# Patient Record
Sex: Male | Born: 1960 | Race: White | Hispanic: No | Marital: Married | Smoking: Never smoker
Health system: Southern US, Community
[De-identification: ages and names within clinical notes are randomized; demographics above are authoritative.]

## PROBLEM LIST (undated history)

## (undated) DIAGNOSIS — F419 Anxiety disorder, unspecified: Secondary | ICD-10-CM

## (undated) DIAGNOSIS — M1990 Unspecified osteoarthritis, unspecified site: Secondary | ICD-10-CM

## (undated) DIAGNOSIS — M47817 Spondylosis without myelopathy or radiculopathy, lumbosacral region: Secondary | ICD-10-CM

## (undated) DIAGNOSIS — J320 Chronic maxillary sinusitis: Secondary | ICD-10-CM

## (undated) DIAGNOSIS — M4800 Spinal stenosis, site unspecified: Secondary | ICD-10-CM

## (undated) DIAGNOSIS — K1370 Unspecified lesions of oral mucosa: Secondary | ICD-10-CM

## (undated) DIAGNOSIS — M51379 Other intervertebral disc degeneration, lumbosacral region without mention of lumbar back pain or lower extremity pain: Secondary | ICD-10-CM

## (undated) DIAGNOSIS — N529 Male erectile dysfunction, unspecified: Secondary | ICD-10-CM

## (undated) DIAGNOSIS — M5137 Other intervertebral disc degeneration, lumbosacral region: Secondary | ICD-10-CM

## (undated) DIAGNOSIS — M549 Dorsalgia, unspecified: Secondary | ICD-10-CM

## (undated) HISTORY — DX: Anxiety disorder, unspecified: F41.9

## (undated) HISTORY — DX: Chronic maxillary sinusitis: J32.0

## (undated) HISTORY — PX: BACK SURGERY: SHX140

## (undated) HISTORY — DX: Other intervertebral disc degeneration, lumbosacral region: M51.37

## (undated) HISTORY — DX: Spondylosis without myelopathy or radiculopathy, lumbosacral region: M47.817

## (undated) HISTORY — DX: Unspecified osteoarthritis, unspecified site: M19.90

## (undated) HISTORY — DX: Unspecified lesions of oral mucosa: K13.70

## (undated) HISTORY — DX: Dorsalgia, unspecified: M54.9

## (undated) HISTORY — DX: Spinal stenosis, site unspecified: M48.00

## (undated) HISTORY — DX: Other intervertebral disc degeneration, lumbosacral region without mention of lumbar back pain or lower extremity pain: M51.379

## (undated) HISTORY — DX: Male erectile dysfunction, unspecified: N52.9

## (undated) HISTORY — PX: COLONOSCOPY: SHX174

---

## 2011-03-18 ENCOUNTER — Institutional Professional Consult (permissible substitution): Payer: Self-pay | Admitting: Internal Medicine

## 2012-09-21 ENCOUNTER — Encounter: Payer: Self-pay | Admitting: Internal Medicine

## 2012-12-20 ENCOUNTER — Ambulatory Visit: Payer: Self-pay | Admitting: Internal Medicine

## 2013-03-22 ENCOUNTER — Encounter: Payer: Self-pay | Admitting: Internal Medicine

## 2013-03-22 ENCOUNTER — Ambulatory Visit (INDEPENDENT_AMBULATORY_CARE_PROVIDER_SITE_OTHER): Payer: BC Managed Care – PPO | Admitting: Internal Medicine

## 2013-03-22 VITALS — BP 116/80 | HR 64 | Temp 98.2°F | Ht 66.5 in | Wt 206.0 lb

## 2013-03-22 DIAGNOSIS — F32A Depression, unspecified: Secondary | ICD-10-CM | POA: Insufficient documentation

## 2013-03-22 DIAGNOSIS — F411 Generalized anxiety disorder: Secondary | ICD-10-CM

## 2013-03-22 DIAGNOSIS — Z113 Encounter for screening for infections with a predominantly sexual mode of transmission: Secondary | ICD-10-CM

## 2013-03-22 DIAGNOSIS — N529 Male erectile dysfunction, unspecified: Secondary | ICD-10-CM | POA: Insufficient documentation

## 2013-03-22 DIAGNOSIS — F419 Anxiety disorder, unspecified: Secondary | ICD-10-CM

## 2013-03-22 LAB — COMPREHENSIVE METABOLIC PANEL
AST: 29 U/L (ref 0–37)
Albumin: 3.8 g/dL (ref 3.5–5.2)
Alkaline Phosphatase: 59 U/L (ref 39–117)
Glucose, Bld: 120 mg/dL — ABNORMAL HIGH (ref 70–99)
Potassium: 3.7 mEq/L (ref 3.5–5.1)
Sodium: 135 mEq/L (ref 135–145)
Total Bilirubin: 0.6 mg/dL (ref 0.3–1.2)
Total Protein: 6.8 g/dL (ref 6.0–8.3)

## 2013-03-22 LAB — URINALYSIS
Bilirubin Urine: NEGATIVE
Hgb urine dipstick: NEGATIVE
Ketones, ur: NEGATIVE
Leukocytes, UA: NEGATIVE
Nitrite: NEGATIVE
Specific Gravity, Urine: 1.01 (ref 1.000–1.030)
Total Protein, Urine: NEGATIVE
Urine Glucose: NEGATIVE
Urobilinogen, UA: 0.2 (ref 0.0–1.0)
pH: 6 (ref 5.0–8.0)

## 2013-03-22 LAB — HIV ANTIBODY (ROUTINE TESTING W REFLEX): HIV: NONREACTIVE

## 2013-03-22 NOTE — Assessment & Plan Note (Addendum)
Request screening for STDs, he is asx Labs Safe sex discussed  Ok to call results to cell phone

## 2013-03-22 NOTE — Assessment & Plan Note (Signed)
Per Dr Excell Seltzer, psychiatry, pt concerned about the # of meds he takes. rec to discuss rx w/ psychiatry. Will monitor long term meds use w/  A CMP

## 2013-03-22 NOTE — Patient Instructions (Addendum)
Next visit in 4-6 months , fasting for a physical Please sign a ROI to get records from previous MD (labs-EKG-XRs-colonoscopies)  Safe Sex Safe sex is about reducing the risk of giving or getting a sexually transmitted disease (STD). STDs are spread through sexual contact involving the genitals, mouth, or rectum. Some STDS can be cured and others cannot. Safe sex can also prevent unintended pregnancies.  SAFE SEX PRACTICES  Limit your sexual activity to only one partner who is only having sex with you.  Talk to your partner about their past partners, past STDs, and drug use.  Use a condom every time you have sexual intercourse. This includes vaginal, oral, and anal sexual activity. Both females and males should wear condoms during oral sex. Only use latex or polyurethane condoms and water-based lubricants. Petroleum-based lubricants or oils used to lubricate a condom will weaken the condom and increase the chance that it will break. The condom should be in place from the beginning to the end of sexual activity. Wearing a condom reduces, but does not completely eliminate, your risk of getting or giving a STD. STDs can be spread by contact with skin of surrounding areas.  Get vaccinated for hepatitis B and HPV.  Avoid alcohol and recreational drugs which can affect your judgement. You may forget to use a condom or participate in high-risk sex.  For females, avoid douching after sexual intercourse. Douching can spread an infection farther into the reproductive tract.  Check your body for signs of sores, blisters, rashes, or unusual discharge. See your caregiver if you notice any of these signs.  Avoid sexual contact if you have symptoms of an infection or are being treated for an STD. If you or your partner has herpes, avoid sexual contact when blisters are present. Use condoms at all other times.  See your caregiver for regular screenings, examinations, and tests for STDs. Before having sex with  a new partner, each of you should be screened for STDs and talk about the results with your partner. BENEFITS OF SAFE SEX   There is less of a chance of getting or giving an STD.  You can prevent unwanted or unintended pregnancies.  By discussing safer sex concerns with your partner, you may increase feelings of intimacy, comfort, trust, and honesty between the both of you. Document Released: 11/20/2004 Document Revised: 07/07/2012 Document Reviewed: 04/05/2012 [facility] Patient Information 2014 [place], [place].

## 2013-03-22 NOTE — Progress Notes (Signed)
  Subjective:    Patient ID: Colton Mcconnell, male    DOB: August 07, 1961, 52 y.o.   MRN: [id number]  HPI New patient, here to get established. In general feels well. Sees a psychiatrist for anxiety, he is concerned about the number of medications that he takes. Also, he is concerned about STDs and  likes to be checked.  Past Medical History  Diagnosis Date  . Anxiety   . Erectile dysfunction     Dr Isabel Caprice   Past Surgical History  Procedure Laterality Date  . Back surgery      cysts removed from spine age 77 and 6   History   Social History  . Marital Status: Married    Spouse Name: N/A    Number of Children: 2  . Years of Education: N/A   Occupational History  . Programmer, applications   Social History Main Topics  . Smoking status: Never Smoker   . Smokeless tobacco: Never Used  . Alcohol Use: Yes     Comment: used to be a heavy drinker, now drinks socially  . Drug Use: No  . Sexually Active: Not on file   Other Topics Concern  . Not on file   Social History Narrative   2 boys, 2nd wife has 2 daughters   Family History  Problem Relation Age of Onset  . CAD Father     cabg at age 46  . Hypertension Father   . Diabetes Father   . Colon cancer Neg Hx   . Prostate cancer Neg Hx   . Breast cancer Mother   . Alcohol abuse Other     many family members     Review of Systems Travels a lot for work, that creates anxiety. Denies any chest pain or shortness or breath Denies any fever, chills, dysuria, gross hematuria, no penile discharge or genital ulcers. Occasional diarrhea when he is out of the country on trips.    Objective:   Physical Exam General -- alert, well-developed,NAD .    Lungs -- normal respiratory effort, no intercostal retractions, no accessory muscle use, and normal breath sounds.   Heart-- normal rate, regular rhythm, no murmur, and no gallop.   Abdomen--soft, non-tender, no distention, no masses, no HSM, no guarding, and no rigidity.    Extremities-- no pretibial edema bilaterally GU-- scrotal contents normal, penis without lesions or discharge. Groins without LADs. Neurologic-- alert & oriented X3 and strength normal in all extremities. Psych-- Cognition and judgment appear intact. Alert and cooperative with normal attention span and concentration.  not anxious appearing and not depressed appearing.       Assessment & Plan:

## 2013-05-09 ENCOUNTER — Ambulatory Visit (INDEPENDENT_AMBULATORY_CARE_PROVIDER_SITE_OTHER): Payer: BC Managed Care – PPO | Admitting: Internal Medicine

## 2013-05-09 ENCOUNTER — Encounter: Payer: Self-pay | Admitting: Internal Medicine

## 2013-05-09 VITALS — BP 134/80 | HR 80 | Temp 98.2°F | Wt 208.2 lb

## 2013-05-09 DIAGNOSIS — F419 Anxiety disorder, unspecified: Secondary | ICD-10-CM

## 2013-05-09 DIAGNOSIS — F411 Generalized anxiety disorder: Secondary | ICD-10-CM

## 2013-05-09 DIAGNOSIS — Z113 Encounter for screening for infections with a predominantly sexual mode of transmission: Secondary | ICD-10-CM

## 2013-05-09 DIAGNOSIS — N529 Male erectile dysfunction, unspecified: Secondary | ICD-10-CM

## 2013-05-09 NOTE — Assessment & Plan Note (Addendum)
Patient taking Celexa 40 mg, complains of difficulty with ejaculation and inability to lose weight despite being active. I think he is experiencing two side effects from SSRIs , he is working with his psychiatrist to get off Celexa. Until that happened, I don't think we need to do further testing to r/o other etiologies. He felt better after I gave him my opinion

## 2013-05-09 NOTE — Assessment & Plan Note (Signed)
Having issues with difficulty ejaculating however ED is well treated with Cialis

## 2013-05-09 NOTE — Progress Notes (Signed)
  Subjective:    Patient ID: Colton Mcconnell, male    DOB: 1961/01/05, 52 y.o.   MRN: [id number]  HPI Acute visit His chief complaint today is difficulty with eyaculation, has been unable to climax in 12 weeks. He also complains of inability to lose weight despite being very active  Past Medical History  Diagnosis Date  . Anxiety   . Erectile dysfunction     Dr Isabel Caprice   Past Surgical History  Procedure Laterality Date  . Back surgery      cysts removed from spine age 52 and 15     Review of Systems ++ stress at work. ED well-controlled w/ Cialis     Objective:   Physical Exam  General -- alert, well-developed, NAD, healthy appearing  Neurologic-- alert & oriented X3 and strength normal in all extremities. Psych-- Cognition and judgment appear intact. Alert and cooperative with normal attention span and concentration.  slt  anxious appearing and not depressed appearing.      Assessment & Plan:

## 2013-05-09 NOTE — Assessment & Plan Note (Signed)
Will check for hep B and C on RTC

## 2013-07-22 ENCOUNTER — Telehealth: Payer: Self-pay

## 2013-07-22 NOTE — Telephone Encounter (Signed)
HM not UTD Will need ROI signed to obtain records Meds reconciled, allergies and pharmacy verified.

## 2013-07-25 ENCOUNTER — Ambulatory Visit: Payer: BC Managed Care – PPO | Admitting: Internal Medicine

## 2013-07-25 ENCOUNTER — Ambulatory Visit (INDEPENDENT_AMBULATORY_CARE_PROVIDER_SITE_OTHER): Payer: BC Managed Care – PPO | Admitting: Internal Medicine

## 2013-07-25 ENCOUNTER — Encounter: Payer: Self-pay | Admitting: Internal Medicine

## 2013-07-25 VITALS — BP 110/66 | HR 67 | Temp 98.8°F | Ht 66.0 in | Wt 195.0 lb

## 2013-07-25 DIAGNOSIS — Z23 Encounter for immunization: Secondary | ICD-10-CM

## 2013-07-25 DIAGNOSIS — M549 Dorsalgia, unspecified: Secondary | ICD-10-CM

## 2013-07-25 DIAGNOSIS — Z0000 Encounter for general adult medical examination without abnormal findings: Secondary | ICD-10-CM

## 2013-07-25 LAB — CBC WITH DIFFERENTIAL/PLATELET
Basophils Relative: 0.2 % (ref 0.0–3.0)
Eosinophils Relative: 2.1 % (ref 0.0–5.0)
Hemoglobin: 14.3 g/dL (ref 13.0–17.0)
Lymphocytes Relative: 13.3 % (ref 12.0–46.0)
Neutro Abs: 6.2 10*3/uL (ref 1.4–7.7)
Neutrophils Relative %: 78 % — ABNORMAL HIGH (ref 43.0–77.0)
RBC: 4.5 Mil/uL (ref 4.22–5.81)
WBC: 7.9 10*3/uL (ref 4.5–10.5)

## 2013-07-25 LAB — HEMOGLOBIN A1C: Hgb A1c MFr Bld: 5.9 % (ref 4.6–6.5)

## 2013-07-25 LAB — TSH: TSH: 2.01 u[IU]/mL (ref 0.35–5.50)

## 2013-07-25 MED ORDER — CYCLOBENZAPRINE HCL 10 MG PO TABS: 10.0000 mg | ORAL_TABLET | Freq: Every evening | ORAL | Status: DC | PRN

## 2013-07-25 MED ORDER — HYDROCODONE-ACETAMINOPHEN 5-325 MG PO TABS: 1.0000 | ORAL_TABLET | Freq: Four times a day (QID) | ORAL | Status: DC | PRN

## 2013-07-25 NOTE — Addendum Note (Signed)
Addended by: Baldwin [place] on: 07/25/2013 11:50 AM   Modules accepted: Orders

## 2013-07-25 NOTE — Assessment & Plan Note (Addendum)
Td ~ 2010 per pt zostavax-- discussed, declined  Flu shot, benefits discussed, pt declined EKG nsr, no old EKGs, no CP Cscope at age 52 per pt, discussed need of further CCS, iFOB v Cscope, elected Cscope, referral will be done Labs  Reports at some point had ED, saw urology, was told testosterone was low, pt declined HRT, feels well now, declined to recheck testosterone Diet-exercise discussed

## 2013-07-25 NOTE — Progress Notes (Signed)
  Subjective:    Patient ID: Colton Mcconnell, male    DOB: November 13, 1960, 52 y.o.   MRN: [id number]  HPI CPX Also reports this morning developed back pain, this is not something new to him, some radiation to the left buttock but otherwise no lower extremity paresthesias.  Past Medical History  Diagnosis Date  . Anxiety   . Erectile dysfunction   . Back pain     sporadic   Past Surgical History  Procedure Laterality Date  . Back surgery      cysts removed from spine age 5 and 73   History   Social History  . Marital Status: Married    Spouse Name: N/A    Number of Children: 2  . Years of Education: N/A   Occupational History  . Programmer, applications   Social History Main Topics  . Smoking status: Never Smoker   . Smokeless tobacco: Never Used  . Alcohol Use: Yes     Comment: used to be a heavy drinker, now drinks socially  . Drug Use: No  . Sexual Activity: Yes   Other Topics Concern  . Not on file   Social History Narrative   2 boys, 2nd wife has 2 daughters   Family History  Problem Relation Age of Onset  . CAD Father     cabg at age 40  . Hypertension Father   . Diabetes Father   . Colon cancer Neg Hx   . Prostate cancer Neg Hx   . Breast cancer Mother   . Alcohol abuse Other     many family members     Review of Systems Diet-- trying to do better, taking less sweats Exercise-- trying to do better, started spinning, running No  CP, SOB; lower extremity edema occ w/ flying Denies  nausea, vomiting diarrhea Denies  blood in the stools (-) cough, sputum production No dysuria, gross hematuria, difficulty urinating   ++ stress at work   Denies suicidal ideas        Objective:   Physical Exam BP 110/66  Pulse 67  Temp(Src) 98.8 F (37.1 C) (Oral)  Ht 5\' 6"  (1.676 m)  Wt 195 lb (88.451 kg)  BMI 31.49 kg/m2  SpO2 97% General -- alert, well-developed, NAD.  Neck --no thyromegaly Lungs -- normal respiratory effort, no intercostal  retractions, no accessory muscle use, and normal breath sounds.  Heart-- normal rate, regular rhythm, no murmur.  Abdomen-- Not distended, good bowel sounds,soft, non-tender. No rebound or rigidity.  Rectal-- No external abnormalities noted. Normal sphincter tone. No rectal masses or tenderness. Brown stool, Hemoccult negative  Prostate--Prostate gland firm and smooth, no enlargement, nodularity, tenderness, mass, asymmetry or induration. Extremities-- no pretibial edema bilaterally  Neurologic--  alert & oriented X3. Speech normal, gait normal, strength normal in all extremities.  Psych-- Cognition and judgment appear intact. Cooperative with normal attention span and concentration. No anxious appearing , no depressed appearing.      Assessment & Plan:

## 2013-07-25 NOTE — Patient Instructions (Signed)
For back pain: Rest, no heavy lifting, heating pad. Flexeril at night Motrin as needed for pain. If the pain continue, use Vicodin, watch for somnolence. Call if no better in few days. Next visit one year

## 2013-07-25 NOTE — Assessment & Plan Note (Addendum)
Sporadic back pain, current exhacerbation started this morning after he did some lifting. Usually he goes to the urgent care, gets Flexeril and Vicodin . Plan: Flexeril, Vicodin, call if not better, see prescriptions

## 2013-08-09 ENCOUNTER — Encounter: Payer: Self-pay | Admitting: Internal Medicine

## 2013-09-01 ENCOUNTER — Other Ambulatory Visit: Payer: Self-pay

## 2013-09-05 ENCOUNTER — Ambulatory Visit (AMBULATORY_SURGERY_CENTER): Payer: Self-pay | Admitting: *Deleted

## 2013-09-05 VITALS — Ht 66.0 in | Wt 200.4 lb

## 2013-09-05 DIAGNOSIS — Z1211 Encounter for screening for malignant neoplasm of colon: Secondary | ICD-10-CM

## 2013-09-05 MED ORDER — MOVIPREP 100 G PO SOLR: ORAL | Status: DC

## 2013-09-05 NOTE — Progress Notes (Signed)
No allergies to eggs or soy. No problems with anesthesia.  

## 2013-09-06 ENCOUNTER — Encounter: Payer: Self-pay | Admitting: Internal Medicine

## 2013-09-26 ENCOUNTER — Encounter: Payer: BC Managed Care – PPO | Admitting: Internal Medicine

## 2013-11-11 ENCOUNTER — Other Ambulatory Visit: Payer: Self-pay | Admitting: *Deleted

## 2013-11-11 ENCOUNTER — Telehealth: Payer: Self-pay | Admitting: *Deleted

## 2013-11-11 MED ORDER — HYDROCODONE-ACETAMINOPHEN 5-325 MG PO TABS
1.0000 | ORAL_TABLET | Freq: Four times a day (QID) | ORAL | Status: DC | PRN
Start: 2013-11-11 — End: 2014-08-01

## 2013-11-11 MED ORDER — CYCLOBENZAPRINE HCL 10 MG PO TABS: 10.0000 mg | ORAL_TABLET | Freq: Every evening | ORAL | Status: DC | PRN

## 2013-11-11 NOTE — Telephone Encounter (Signed)
Patient called and stated that during exercise this morning, his "back went out" like it has done in the past. He states he leaves at 4 pm today to go to [place]. He is requesting Flexeril and Vicodin. His last OV was 07/25/13 Flexeril Last refill: 07/25/13 #21, 0 refills Vicodin Last refill: 07/25/13 #30, 0 refills No contract on file

## 2013-11-11 NOTE — Telephone Encounter (Signed)
Medications refilled. Patient called by Shann Medal, RMA. JG//CMA

## 2013-11-11 NOTE — Telephone Encounter (Signed)
Okay to refill the same amounts. Advise patient to be seen if pain severe or  not improving in few days. He may like to try also a warm compress and Motrin

## 2014-08-01 ENCOUNTER — Ambulatory Visit (INDEPENDENT_AMBULATORY_CARE_PROVIDER_SITE_OTHER): Payer: BC Managed Care – PPO | Admitting: Internal Medicine

## 2014-08-01 ENCOUNTER — Encounter: Payer: Self-pay | Admitting: Internal Medicine

## 2014-08-01 VITALS — BP 133/84 | HR 66 | Temp 98.5°F | Wt 200.1 lb

## 2014-08-01 DIAGNOSIS — R7303 Prediabetes: Secondary | ICD-10-CM

## 2014-08-01 DIAGNOSIS — R7309 Other abnormal glucose: Secondary | ICD-10-CM

## 2014-08-01 DIAGNOSIS — F419 Anxiety disorder, unspecified: Secondary | ICD-10-CM

## 2014-08-01 DIAGNOSIS — R739 Hyperglycemia, unspecified: Secondary | ICD-10-CM | POA: Insufficient documentation

## 2014-08-01 DIAGNOSIS — M545 Low back pain, unspecified: Secondary | ICD-10-CM

## 2014-08-01 LAB — GLUCOSE, POCT (MANUAL RESULT ENTRY): POC Glucose: 89 mg/dl (ref 70–99)

## 2014-08-01 MED ORDER — PREDNISONE 10 MG PO TABS: ORAL_TABLET | ORAL | Status: DC

## 2014-08-01 MED ORDER — CYCLOBENZAPRINE HCL 10 MG PO TABS: 10.0000 mg | ORAL_TABLET | Freq: Two times a day (BID) | ORAL | Status: DC | PRN

## 2014-08-01 NOTE — Progress Notes (Signed)
Subjective:    Patient ID: Colton Mcconnell, male    DOB: 1961-03-05, 53 y.o.   MRN: [id number]  DOS:  08/01/2014 Type of visit - description : acute Interval history: Patient has a history of on and off back pain, he was remodeling a bathroom last week and developed L>R low back pain. About 3 days ago he went jogging and the pain got worse. Currently pain is slightly different than previous exacerbations, more on the L lower back with some radiation to the buttock and groin. Worse when he sits down or stands up.  Also needs a referral to psychiatry, see assessment and plan   ROS Denies any fever or chills No dysuria, gross hematuria or difficulty urinating No rash in the area. No lower extremity paresthesias No fall or actual injury.  Past Medical History  Diagnosis Date  . Anxiety   . Erectile dysfunction   . Back pain     sporadic    Past Surgical History  Procedure Laterality Date  . Back surgery      cysts removed from spine age 63 and 31    History   Social History  . Marital Status: Married    Spouse Name: N/A    Number of Children: 2  . Years of Education: N/A   Occupational History  . Furniture conservator/restorer   Social History Main Topics  . Smoking status: Never Smoker   . Smokeless tobacco: Never Used  . Alcohol Use: 3.6 oz/week    6 Cans of beer per week     Comment: used to be a heavy drinker, now drinks socially  . Drug Use: No  . Sexual Activity: Yes   Other Topics Concern  . Not on file   Social History Narrative   2 boys, 2nd wife has 2 daughters        Medication List       This list is accurate as of: 08/01/14  8:01 PM.  Always use your most recent med list.               cyclobenzaprine 10 MG tablet  Commonly known as:  FLEXERIL  Take 1 tablet (10 mg total) by mouth 2 (two) times daily as needed for muscle spasms.     FLUoxetine 10 MG capsule  Commonly known as:  PROZAC  Take 10 mg by mouth daily. Take 4 tablets daily      lamoTRIgine 100 MG tablet  Commonly known as:  LAMICTAL  Take 100 mg by mouth 2 (two) times daily.     LORazepam 1 MG tablet  Commonly known as:  ATIVAN  Take 1 mg by mouth 3 (three) times daily as needed for anxiety.     predniSONE 10 MG tablet  Commonly known as:  DELTASONE  4 tablets x 2 days, 3 tabs x 2 days, 2 tabs x 2 days, 1 tab x 2 days           Objective:   Physical Exam BP 133/84  Pulse 66  Temp(Src) 98.5 F (36.9 C) (Oral)  Wt 200 lb 2 oz (90.776 kg)  SpO2 97% General -- alert, well-developed, NAD.  Abdomen-- Not distended, good bowel sounds,soft, non-tender. No rebound or rigidity.  GU-- Scrotal contents normal, no inguinal hernias Extremities-- no pretibial edema bilaterally  Neurologic--  alert & oriented X3. Speech normal, gait -posture antalgic, strength slt decreased L leg  DTRs -- absent R ankle kerk Straight leg test mildly  positive on the L Psych-- Cognition and judgment appear intact. Cooperative with normal attention span and concentration. No anxious or depressed appearing.        Assessment & Plan:

## 2014-08-01 NOTE — Progress Notes (Signed)
Pre visit review using our clinic review tool, if applicable. No additional management support is needed unless otherwise documented below in the visit note. 

## 2014-08-01 NOTE — Assessment & Plan Note (Signed)
Has seen Dr. Luana Shu for a while, a psychiatrist, he retired, needs a referral, will arrange

## 2014-08-01 NOTE — Assessment & Plan Note (Signed)
CBG 89 today, okay to prescribe prednisone

## 2014-08-01 NOTE — Patient Instructions (Addendum)
Rest, no heavy lifting Take prednisone for a few days take Flexeril, a muscle relaxant, as needed, will cause drowsiness. Tylenol  500 mg OTC 2 tabs a day every 8 hours as needed for pain Call if not gradually improving in the next 10 days, call anytime if symptoms severe.  Schedule a physical exam, fasting (~ 3 weeks)

## 2014-08-01 NOTE — Assessment & Plan Note (Signed)
Back pain, Pain related to back or hip, question of a radiculopathy. Plan: Rest, Tylenol, prednisone, Flexeril. If no better he'll need further eval, has a history of back surgery as a child.

## 2014-10-25 ENCOUNTER — Encounter: Payer: BC Managed Care – PPO | Admitting: Internal Medicine

## 2014-11-27 ENCOUNTER — Telehealth: Payer: Self-pay | Admitting: Internal Medicine

## 2014-11-27 NOTE — Telephone Encounter (Signed)
Patient Name: Colton Mcconnell DOB: 04-09-1961 Initial Comment Caller states he fell a couple weeks ago hit back of head having headaches and still has the lump. Nurse Assessment Nurse: Marcelline Deist, RN, Lynda Date/Time (Eastern Time): 11/27/2014 1:38:50 PM Confirm and document reason for call. If symptomatic, describe symptoms. ---Caller states he fell a couple weeks ago hit back of head on edge of ceramic tile, having headaches and still has the lump - size of fingertip, came down. Has the patient traveled out of the country within the last 30 days? ---Not Applicable Does the patient require triage? ---Yes Related visit to physician within the last 2 weeks? ---No Does the PT have any chronic conditions? (i.e. diabetes, asthma, etc.) ---No Guidelines Guideline Title Affirmed Question Affirmed Notes Head Injury [1] After 72 hours AND [2] headache persists Final Disposition User See PCP When Office is Open (within 3 days) Marcelline Deist, Therapist, sports, [employer]

## 2014-11-27 NOTE — Telephone Encounter (Signed)
Appointment has been scheduled.

## 2014-11-28 ENCOUNTER — Ambulatory Visit: Payer: Self-pay | Admitting: Internal Medicine

## 2014-11-29 ENCOUNTER — Ambulatory Visit (HOSPITAL_BASED_OUTPATIENT_CLINIC_OR_DEPARTMENT_OTHER)
Admission: RE | Admit: 2014-11-29 | Discharge: 2014-11-29 | Disposition: A | Discharge status: Home / Self Care | Payer: 59 | Source: Ambulatory Visit | Attending: Internal Medicine | Admitting: Internal Medicine

## 2014-11-29 ENCOUNTER — Ambulatory Visit (HOSPITAL_COMMUNITY): Payer: 59

## 2014-11-29 ENCOUNTER — Encounter: Payer: Self-pay | Admitting: Internal Medicine

## 2014-11-29 ENCOUNTER — Ambulatory Visit (INDEPENDENT_AMBULATORY_CARE_PROVIDER_SITE_OTHER): Payer: 59 | Admitting: Internal Medicine

## 2014-11-29 VITALS — BP 105/68 | HR 75 | Temp 98.2°F | Ht 66.0 in | Wt 202.4 lb

## 2014-11-29 DIAGNOSIS — M542 Cervicalgia: Secondary | ICD-10-CM | POA: Diagnosis not present

## 2014-11-29 DIAGNOSIS — W19XXXA Unspecified fall, initial encounter: Secondary | ICD-10-CM | POA: Insufficient documentation

## 2014-11-29 DIAGNOSIS — R51 Headache: Secondary | ICD-10-CM | POA: Insufficient documentation

## 2014-11-29 DIAGNOSIS — S0990XA Unspecified injury of head, initial encounter: Secondary | ICD-10-CM

## 2014-11-29 DIAGNOSIS — M47892 Other spondylosis, cervical region: Secondary | ICD-10-CM | POA: Diagnosis not present

## 2014-11-29 MED ORDER — CYCLOBENZAPRINE HCL 10 MG PO TABS: 10.0000 mg | ORAL_TABLET | Freq: Two times a day (BID) | ORAL | Status: DC | PRN

## 2014-11-29 NOTE — Patient Instructions (Signed)
We are ordering a CT of the head and a  x-ray of the neck today  Take Tylenol as needed and Flexeril (a muscle relaxant) at night  Call if you are not gradually improving in the next 2 or 3 weeks     Head Injury You have received a head injury. It does not appear serious at this time. Headaches and vomiting are common following head injury. It should be easy to awaken from sleeping. Sometimes it is necessary for you to stay in the emergency department for a while for observation. Sometimes admission to the hospital may be needed. After injuries such as yours, most problems occur within the first 24 hours, but side effects may occur up to 7-10 days after the injury. It is important for you to carefully monitor your condition and contact your health care provider or seek immediate medical care if there is a change in your condition. WHAT ARE THE TYPES OF HEAD INJURIES? Head injuries can be as minor as a bump. Some head injuries can be more severe. More severe head injuries include:  A jarring injury to the brain (concussion).  A bruise of the brain (contusion). This mean there is bleeding in the brain that can cause swelling.  A cracked skull (skull fracture).  Bleeding in the brain that collects, clots, and forms a bump (hematoma). WHAT CAUSES A HEAD INJURY? A serious head injury is most likely to happen to someone who is in a car wreck and is not wearing a seat belt. Other causes of major head injuries include bicycle or motorcycle accidents, sports injuries, and falls. HOW ARE HEAD INJURIES DIAGNOSED? A complete history of the event leading to the injury and your current symptoms will be helpful in diagnosing head injuries. Many times, pictures of the brain, such as CT or MRI are needed to see the extent of the injury. Often, an overnight hospital stay is necessary for observation.  WHEN SHOULD I SEEK IMMEDIATE MEDICAL CARE?  You should get help right away if:  You have confusion or  drowsiness.  You feel sick to your stomach (nauseous) or have continued, forceful vomiting.  You have dizziness or unsteadiness that is getting worse.  You have severe, continued headaches not relieved by medicine. Only take over-the-counter or prescription medicines for pain, fever, or discomfort as directed by your health care provider.  You do not have normal function of the arms or legs or are unable to walk.  You notice changes in the black spots in the center of the colored part of your eye (pupil).  You have a clear or bloody fluid coming from your nose or ears.  You have a loss of vision. During the next 24 hours after the injury, you must stay with someone who can watch you for the warning signs. This person should contact local emergency services (911 in the [place] if you have seizures, you become unconscious, or you are unable to wake up. HOW CAN I PREVENT A HEAD INJURY IN THE FUTURE? The most important factor for preventing major head injuries is avoiding motor vehicle accidents. To minimize the potential for damage to your head, it is crucial to wear seat belts while riding in motor vehicles. Wearing helmets while bike riding and playing collision sports (like football) is also helpful. Also, avoiding dangerous activities around the house will further help reduce your risk of head injury.  WHEN CAN I RETURN TO NORMAL ACTIVITIES AND ATHLETICS? You should be reevaluated by your health  care provider before returning to these activities. If you have any of the following symptoms, you should not return to activities or contact sports until 1 week after the symptoms have stopped:  Persistent headache.  Dizziness or vertigo.  Poor attention and concentration.  Confusion.  Memory problems.  Nausea or vomiting.  Fatigue or tire easily.  Irritability.  Intolerant of bright lights or loud noises.  Anxiety or depression.  Disturbed sleep. MAKE SURE YOU:   Understand these  instructions.  Will watch your condition.  Will get help right away if you are not doing well or get worse. Document Released: 10/13/2005 Document Revised: 10/18/2013 Document Reviewed: 06/20/2013 [facility] Patient Information 2015 [place], [place]. This information is not intended to replace advice given to you by your health care provider. Make sure you discuss any questions you have with your health care provider.

## 2014-11-29 NOTE — Assessment & Plan Note (Addendum)
Status post head injury as described in the history of present illness, he still is slightly sore at the area of impact which is expected. He also has mild global headaches, possibly a postconcussion syndrome but other etiologies need to be rule out. Chronic neck pain has been slightly more noticeable lately. His neurological exam is not completely normal, the CN findings are chronic according to the patient and unchanged. Plan: CT head X-rays of the cervical spine Tylenol, Flexeril, call if not gradually improving in the next 2 or 3 weeks

## 2014-11-29 NOTE — Progress Notes (Signed)
Subjective:    Patient ID: Colton Mcconnell, male    DOB: 1961-10-22, 54 y.o.   MRN: [id number]  DOS:  11/29/2014 Type of visit - description : acute Interval history: 3 weeks ago, was taking a shower at  home and had an accidental fall. He fell backwards, hit his head in a tile corner, did bleed briefly, no LOC, did not look for any medical help until now. He is still sore at the area of impact and has developed mild but persistent global headache.    Review of Systems  denies nausea vomiting When asked, admits to chronic neck pain, slightly worse lately?. Denies dizziness, diplopia. No upper or lower extremity paresthesias, no gait abnormalities. His psychiatrist adjusted his medication few days ago. Headache was going on before changing medicines  Past Medical History  Diagnosis Date  . Anxiety   . Erectile dysfunction   . Back pain     sporadic    Past Surgical History  Procedure Laterality Date  . Back surgery      cysts removed from spine age 70 and 70    History   Social History  . Marital Status: Married    Spouse Name: N/A    Number of Children: 2  . Years of Education: N/A   Occupational History  . Furniture conservator/restorer   Social History Main Topics  . Smoking status: Never Smoker   . Smokeless tobacco: Never Used  . Alcohol Use: 3.6 oz/week    6 Cans of beer per week     Comment: used to be a heavy drinker, now drinks socially  . Drug Use: No  . Sexual Activity: Yes   Other Topics Concern  . Not on file   Social History Narrative   2 boys, 2nd wife has 2 daughters        Medication List       This list is accurate as of: 11/29/14  7:28 PM.  Always use your most recent med list.               cyclobenzaprine 10 MG tablet  Commonly known as:  FLEXERIL  Take 1 tablet (10 mg total) by mouth 2 (two) times daily as needed for muscle spasms.     lamoTRIgine 100 MG tablet  Commonly known as:  LAMICTAL  Take 100 mg by mouth 2 (two) times  daily.     LORazepam 1 MG tablet  Commonly known as:  ATIVAN  Take 1 mg by mouth 3 (three) times daily as needed for anxiety.     risperiDONE 1 MG tablet  Commonly known as:  RISPERDAL  Take 1 mg by mouth at bedtime.     zaleplon 10 MG capsule  Commonly known as:  SONATA  Take 10 mg by mouth at bedtime as needed for sleep. When traveling           Objective:   Physical Exam  Constitutional: He is oriented to person, place, and time. He appears well-developed. No distress.  HENT:  Head: Normocephalic and atraumatic.  Neck: Normal range of motion. Neck supple.  No TTP  Cardiovascular:  RRR, no murmur, rub or gallop  Pulmonary/Chest: Effort normal. No respiratory distress.  CTA B  Musculoskeletal: Normal range of motion. He exhibits no edema or tenderness.  Neurological: He is alert and oriented to person, place, and time. He exhibits normal muscle tone. Coordination normal.  Speech normal, gait unassisted and normal for age, motor  strength appropriate for age . Face with very mild right-sided paresis . Left eye  with limited mobility externally (not new findings per pt); otherwise motor exam is completely normal DTRs symmetric throughout  Skin: Skin is warm and dry. No pallor.  Psychiatric: He has a normal mood and affect. His behavior is normal. Judgment and thought content normal.  Vitals reviewed.       Assessment & Plan:   Problem List Items Addressed This Visit    Head injury - Primary    Status post hip injury as described in the history of present illness, he feels is slightly sore at the area of impact which is expected. He also has mild global headaches, possibly a postconcussion syndrome but other etiologies need to be rule out. Chronic neck pain has been slightly more noticeable lately. His neurological exam is not completely normal, the CN findings are chronic according to the patient and unchanged. Plan: CT head X-rays of the cervical spine Tylenol,  Flexeril, call if not gradually improving in the next 2 or 3 weeks      Relevant Orders   CT Head Wo Contrast (Completed)   DG Cervical Spine Complete (Completed)

## 2014-11-29 NOTE — Progress Notes (Signed)
Pre visit review using our clinic review tool, if applicable. No additional management support is needed unless otherwise documented below in the visit note. 

## 2014-12-21 ENCOUNTER — Telehealth: Payer: Self-pay | Admitting: Internal Medicine

## 2014-12-21 MED ORDER — CYCLOBENZAPRINE HCL 10 MG PO TABS: 10.0000 mg | ORAL_TABLET | Freq: Two times a day (BID) | ORAL | Status: DC | PRN

## 2014-12-21 NOTE — Telephone Encounter (Signed)
Recommend to go back on Flexeril, to take as needed, send a prescription for 60. No refills. If he is not back to normal in the next few weeks please come back to the office

## 2014-12-21 NOTE — Telephone Encounter (Signed)
Caller name: Alvah Relation to KX:FGHW Call back number: [phone] [facility] [address]  Reason for call:   Patient states that his head is feeling much better since last visit but states that the neck muscles are still spasming down into his back. He states that the flexeril is already gone and has been taking aleve but this has not helped

## 2014-12-21 NOTE — Telephone Encounter (Signed)
Spoke with Pt, informed him that [facility] (# [facility] and 0 RFs). Informed him to let us know if he is not feeling better in several days.

## 2014-12-21 NOTE — Telephone Encounter (Signed)
Please advise 

## 2015-03-29 ENCOUNTER — Encounter: Payer: Self-pay | Admitting: Internal Medicine

## 2015-03-29 ENCOUNTER — Ambulatory Visit (INDEPENDENT_AMBULATORY_CARE_PROVIDER_SITE_OTHER): Payer: 59 | Admitting: Internal Medicine

## 2015-03-29 ENCOUNTER — Ambulatory Visit (HOSPITAL_BASED_OUTPATIENT_CLINIC_OR_DEPARTMENT_OTHER)
Admission: RE | Admit: 2015-03-29 | Discharge: 2015-03-29 | Disposition: A | Discharge status: Home / Self Care | Payer: 59 | Source: Ambulatory Visit | Attending: Internal Medicine | Admitting: Internal Medicine

## 2015-03-29 ENCOUNTER — Other Ambulatory Visit: Payer: Self-pay

## 2015-03-29 VITALS — BP 118/64 | HR 57 | Temp 98.0°F | Ht 66.0 in | Wt 199.2 lb

## 2015-03-29 DIAGNOSIS — M4806 Spinal stenosis, lumbar region: Secondary | ICD-10-CM | POA: Diagnosis not present

## 2015-03-29 DIAGNOSIS — M545 Low back pain, unspecified: Secondary | ICD-10-CM

## 2015-03-29 DIAGNOSIS — F419 Anxiety disorder, unspecified: Secondary | ICD-10-CM

## 2015-03-29 DIAGNOSIS — M5136 Other intervertebral disc degeneration, lumbar region: Secondary | ICD-10-CM | POA: Diagnosis not present

## 2015-03-29 DIAGNOSIS — M5135 Other intervertebral disc degeneration, thoracolumbar region: Secondary | ICD-10-CM | POA: Diagnosis not present

## 2015-03-29 MED ORDER — PREDNISONE 10 MG PO TABS: ORAL_TABLET | ORAL | Status: DC

## 2015-03-29 MED ORDER — MELOXICAM 15 MG PO TABS: 15.0000 mg | ORAL_TABLET | Freq: Every day | ORAL | Status: DC | PRN

## 2015-03-29 NOTE — Progress Notes (Signed)
Subjective:    Patient ID: Colton Mcconnell, male    DOB: Sep 04, 1961, 54 y.o.   MRN: [id number]  DOS:  03/29/2015 Type of visit - description : acute Interval history: 2 months history of back pain different from his on and off generalized ache that he has for years. This time the pain is localized to one specific spot, left from L2, no radiation, worse at the end of the day, decreased by sitting, increased by standing. Increased by exercising as well. OTCs a medications no helping Additionally, Dr. Candis Schatz  won't practice any longer and needs to find a new psychiatrist.    Review of Systems Denies fever chills No falls or injuries No bladder or bowel incontinence No lower extremity paresthesias  Past Medical History  Diagnosis Date  . Anxiety   . Erectile dysfunction   . Back pain     sporadic    Past Surgical History  Procedure Laterality Date  . Back surgery      cysts removed from spine age 58 and 21    History   Social History  . Marital Status: Married    Spouse Name: N/A  . Number of Children: 2  . Years of Education: N/A   Occupational History  . Furniture conservator/restorer   Social History Main Topics  . Smoking status: Never Smoker   . Smokeless tobacco: Never Used  . Alcohol Use: 3.6 oz/week    6 Cans of beer per week     Comment: used to be a heavy drinker, now drinks socially  . Drug Use: No  . Sexual Activity: Yes   Other Topics Concern  . Not on file   Social History Narrative   2 boys, 2nd wife has 2 daughters        Medication List       This list is accurate as of: 03/29/15  2:12 PM.  Always use your most recent med list.               cyclobenzaprine 10 MG tablet  Commonly known as:  FLEXERIL  Take 1 tablet (10 mg total) by mouth 2 (two) times daily as needed for muscle spasms.     lamoTRIgine 100 MG tablet  Commonly known as:  LAMICTAL  Take 100 mg by mouth 2 (two) times daily.     LORazepam 1 MG tablet  Commonly  known as:  ATIVAN  Take 1 mg by mouth 3 (three) times daily as needed for anxiety.     meloxicam 15 MG tablet  Commonly known as:  MOBIC  Take 1 tablet (15 mg total) by mouth daily as needed for pain.     predniSONE 10 MG tablet  Commonly known as:  DELTASONE  3 tabs x 2 days, 2 tabs x 2 days, 1 tab x 2 days     risperiDONE 1 MG tablet  Commonly known as:  RISPERDAL  Take 1 mg by mouth at bedtime.     zaleplon 10 MG capsule  Commonly known as:  SONATA  Take 10 mg by mouth at bedtime as needed for sleep. When traveling           Objective:   Physical Exam  Musculoskeletal:       Back:   BP 118/64 mmHg  Pulse 57  Temp(Src) 98 F (36.7 C) (Oral)  Ht 5\' 6"  (1.676 m)  Wt 199 lb 4 oz (90.379 kg)  BMI 32.18 kg/m2  SpO2 98%  General:   Well developed, well nourished . NAD.  HEENT:  Normocephalic . Face symmetric, atraumatic Skin: Not pale. Not jaundice Neurologic:  alert & oriented X3.  Speech normal, gait appropriate for age and unassisted DTRs: Absent right knee jerk Straight leg test cause some pain of the lower back but no actual radiation. Motor-- normal Psych--  Cognition and judgment appear intact.  Cooperative with normal attention span and concentration.  Behavior appropriate. No anxious or depressed appearing.        Assessment & Plan:

## 2015-03-29 NOTE — Progress Notes (Signed)
Pre visit review using our clinic review tool, if applicable. No additional management support is needed unless otherwise documented below in the visit note. 

## 2015-03-29 NOTE — Assessment & Plan Note (Addendum)
2 month h/o of LBP at a specific area different to previous on-off LBP. H/o surgery remotely at the lower back Plan: Prednisone, mobic, XR, ortho referral

## 2015-03-29 NOTE — Patient Instructions (Signed)
Please take your x-ray downstairs  For pain: Prednisone for a few days Meloxicam once a day as needed for pain.  Always take it with food because may cause gastritis and ulcers.  If you notice nausea, stomach pain, change in the color of stools --->  Stop the medicine and let us know

## 2015-03-29 NOTE — Assessment & Plan Note (Signed)
Dr Candis Schatz will be retiring (?), needs a new psych, resources list provided

## 2015-05-26 DIAGNOSIS — J40 Bronchitis, not specified as acute or chronic: Secondary | ICD-10-CM | POA: Insufficient documentation

## 2015-05-26 DIAGNOSIS — J329 Chronic sinusitis, unspecified: Secondary | ICD-10-CM | POA: Insufficient documentation

## 2015-06-14 ENCOUNTER — Other Ambulatory Visit: Payer: Self-pay

## 2015-06-15 ENCOUNTER — Encounter: Payer: Self-pay | Admitting: Internal Medicine

## 2015-06-15 ENCOUNTER — Ambulatory Visit (INDEPENDENT_AMBULATORY_CARE_PROVIDER_SITE_OTHER): Payer: 59 | Admitting: Internal Medicine

## 2015-06-15 ENCOUNTER — Encounter: Payer: Self-pay | Admitting: Gastroenterology

## 2015-06-15 VITALS — BP 126/82 | HR 71 | Temp 98.0°F | Ht 66.0 in | Wt 191.4 lb

## 2015-06-15 DIAGNOSIS — F419 Anxiety disorder, unspecified: Secondary | ICD-10-CM | POA: Diagnosis not present

## 2015-06-15 DIAGNOSIS — M545 Low back pain, unspecified: Secondary | ICD-10-CM

## 2015-06-15 DIAGNOSIS — Z1211 Encounter for screening for malignant neoplasm of colon: Secondary | ICD-10-CM

## 2015-06-15 MED ORDER — LAMOTRIGINE 100 MG PO TABS: 100.0000 mg | ORAL_TABLET | Freq: Two times a day (BID) | ORAL | Status: DC

## 2015-06-15 MED ORDER — FLUOXETINE HCL 40 MG PO CAPS: 40.0000 mg | ORAL_CAPSULE | Freq: Every day | ORAL | Status: DC

## 2015-06-15 MED ORDER — ZALEPLON 10 MG PO CAPS: 10.0000 mg | ORAL_CAPSULE | Freq: Every evening | ORAL | Status: DC | PRN

## 2015-06-15 MED ORDER — LORAZEPAM 1 MG PO TABS: 1.0000 mg | ORAL_TABLET | Freq: Three times a day (TID) | ORAL | Status: DC | PRN

## 2015-06-15 NOTE — Patient Instructions (Addendum)
  Please get the results and office visit note from Mr. Sabra Heck.

## 2015-06-15 NOTE — Progress Notes (Signed)
Subjective:    Patient ID: Colton Mcconnell, male    DOB: [birth year], 54 y.o.   MRN: [id number]  DOS:  06/15/2015 Type of visit - description : Acute visit Interval history: Anxiety: Having at difficult time refilling his medication, request a refill. Back pain: Saw orthopedic surgery, symptoms are currently controlled with meloxicam. Also complaining of easy bruising, for 2 years, denies blood in the stools, blood in the urine or easy bleeding when he brushes his teeth.    Review of Systems   Past Medical History  Diagnosis Date  . Anxiety   . Erectile dysfunction   . Back pain     sporadic    Past Surgical History  Procedure Laterality Date  . Back surgery      cysts removed from spine age 3 and 20    Social History   Social History  . Marital Status: Married    Spouse Name: N/A  . Number of Children: 2  . Years of Education: N/A   Occupational History  . Furniture conservator/restorer   Social History Main Topics  . Smoking status: Never Smoker   . Smokeless tobacco: Never Used  . Alcohol Use: 3.6 oz/week    6 Cans of beer per week     Comment: used to be a heavy drinker, now drinks socially  . Drug Use: No  . Sexual Activity: Yes   Other Topics Concern  . Not on file   Social History Narrative   2 boys, 2nd wife has 2 daughters        Medication List       This list is accurate as of: 06/15/15 11:59 PM.  Always use your most recent med list.               FLUoxetine 40 MG capsule  Commonly known as:  PROZAC  Take 1 capsule (40 mg total) by mouth daily.     lamoTRIgine 100 MG tablet  Commonly known as:  LAMICTAL  Take 1 tablet (100 mg total) by mouth 2 (two) times daily.     LORazepam 1 MG tablet  Commonly known as:  ATIVAN  Take 1 tablet (1 mg total) by mouth 3 (three) times daily as needed for anxiety.     meloxicam 15 MG tablet  Commonly known as:  MOBIC  Take 1 tablet (15 mg total) by mouth daily as needed for pain.     zaleplon  10 MG capsule  Commonly known as:  SONATA  Take 1 capsule (10 mg total) by mouth at bedtime as needed for sleep. When traveling           Objective:   Physical Exam BP 126/82 mmHg  Pulse 71  Temp(Src) 98 F (36.7 C) (Oral)  Ht 5\' 6"  (1.676 m)  Wt 191 lb 6 oz (86.807 kg)  BMI 30.90 kg/m2  SpO2 93% General:   Well developed, well nourished . NAD.  HEENT:  Normocephalic . Face symmetric, atraumatic Skin: Not pale. Not jaundice. Has a superficial bruise and a very small hematoma at the left bicipital area and left buttock. Neurologic:  alert & oriented X3.  Speech normal, gait appropriate for age and unassisted Psych--  Cognition and judgment appear intact.  Cooperative with normal attention span and concentration.  Behavior appropriate. No anxious or depressed appearing.      Assessment & Plan:     Primary care: Due for a CPX, recently had workup blood work  elsewhere, advise him to get the labs Due for a colonoscopy, referral will be entered. Follow-up 10-2015 for a CPX.  Easy  bruising: For 2 years, will get labs when he comes back

## 2015-06-15 NOTE — Progress Notes (Signed)
Pre visit review using our clinic review tool, if applicable. No additional management support is needed unless otherwise documented below in the visit note. 

## 2015-06-17 NOTE — Assessment & Plan Note (Signed)
Saw orthopedic surgery, had an MRI, diagnosed with a disk problem , he declined to take epidurals, currently taking meloxicam.

## 2015-06-17 NOTE — Assessment & Plan Note (Signed)
Having a very difficult time finding a psychiatrist that he can see long-term, previous psychiatris retired. He recently saw Hubbard Robinson PA supposedly to get refills however he did complete physical exam and it wasn't what the patient was expecting.  He travels to [place] often times x 3-4 weeks, some times unexpectedly and is afraid he won't have enough refills to take with him thus  requesting me to refill his medications.  He takes Ativan on average once daily. Sonata as needed for sleep mostly in the airplane. Symptoms are currently under excellent control without apparent side effects. I agreed to refill his medications for now until he finds a new psychiatrist.

## 2015-07-10 ENCOUNTER — Ambulatory Visit (AMBULATORY_SURGERY_CENTER): Payer: Self-pay | Admitting: *Deleted

## 2015-07-10 VITALS — Ht 66.0 in | Wt 184.8 lb

## 2015-07-10 DIAGNOSIS — Z1211 Encounter for screening for malignant neoplasm of colon: Secondary | ICD-10-CM

## 2015-07-10 MED ORDER — PEG-KCL-NACL-NASULF-NA ASC-C 100 G PO SOLR: 1.0000 | Freq: Once | ORAL | Status: DC

## 2015-07-10 NOTE — Progress Notes (Signed)
Patient denies any allergies to egg or soy products. Patient denies complications with anesthesia/sedation.  Patient denies oxygen use at home and denies diet medications. Emmi instructions for colonoscopy explained and given to patient.  

## 2015-07-12 ENCOUNTER — Encounter: Payer: Self-pay | Admitting: Gastroenterology

## 2015-07-23 ENCOUNTER — Encounter: Payer: 59 | Admitting: Gastroenterology

## 2015-08-20 ENCOUNTER — Other Ambulatory Visit: Payer: Self-pay | Admitting: Internal Medicine

## 2015-08-28 ENCOUNTER — Other Ambulatory Visit: Payer: Self-pay | Admitting: Internal Medicine

## 2015-08-28 NOTE — Telephone Encounter (Signed)
Ok 90, 2 RF

## 2015-08-28 NOTE — Telephone Encounter (Signed)
Rx printed, awaiting MD signature.  

## 2015-08-28 NOTE — Telephone Encounter (Signed)
Pt is requesting refill on Lorazepam.  Last OV: 06/15/2015, CPE scheduled for 11/08/2015 at 0800 Last Fill: 06/15/2015 #90 and 1RF. Pt can take 1 tab tid PRN UDS: None  Will need UDS and contract at next OV.    Please advise.

## 2015-08-28 NOTE — Telephone Encounter (Signed)
Rx faxed to [facility].  

## 2015-09-14 ENCOUNTER — Ambulatory Visit (AMBULATORY_SURGERY_CENTER): Payer: Self-pay | Admitting: *Deleted

## 2015-09-14 VITALS — Ht 66.0 in | Wt 182.0 lb

## 2015-09-14 DIAGNOSIS — Z1211 Encounter for screening for malignant neoplasm of colon: Secondary | ICD-10-CM

## 2015-09-14 NOTE — Progress Notes (Signed)
No egg or soy allergy. No anesthesia problems.  No home O2.  No diet meds.  Pt has movi from previous previsit in Sept 2016.  Pt did not want emmi.

## 2015-09-25 ENCOUNTER — Other Ambulatory Visit: Payer: Self-pay | Admitting: Internal Medicine

## 2015-09-28 ENCOUNTER — Encounter: Payer: Self-pay | Admitting: Gastroenterology

## 2015-09-28 ENCOUNTER — Ambulatory Visit (AMBULATORY_SURGERY_CENTER): Payer: 59 | Admitting: Gastroenterology

## 2015-09-28 VITALS — BP 108/82 | HR 61 | Temp 96.8°F | Resp 19 | Ht 66.0 in | Wt 182.0 lb

## 2015-09-28 DIAGNOSIS — D122 Benign neoplasm of ascending colon: Secondary | ICD-10-CM | POA: Diagnosis not present

## 2015-09-28 DIAGNOSIS — Z1211 Encounter for screening for malignant neoplasm of colon: Secondary | ICD-10-CM | POA: Diagnosis not present

## 2015-09-28 MED ORDER — SODIUM CHLORIDE 0.9 % IV SOLN: 500.0000 mL | INTRAVENOUS | Status: DC

## 2015-09-28 NOTE — Patient Instructions (Signed)
YOU HAD AN ENDOSCOPIC PROCEDURE TODAY AT THE [facility]:   Refer to the procedure report that was given to you for any specific questions about what was found during the examination.  If the procedure report does not answer your questions, please call your gastroenterologist to clarify.  If you requested that your care partner not be given the details of your procedure findings, then the procedure report has been included in a sealed envelope for you to review at your convenience later.  YOU SHOULD EXPECT: Some feelings of bloating in the abdomen. Passage of more gas than usual.  Walking can help get rid of the air that was put into your GI tract during the procedure and reduce the bloating. If you had a lower endoscopy (such as a colonoscopy or flexible sigmoidoscopy) you may notice spotting of blood in your stool or on the toilet paper. If you underwent a bowel prep for your procedure, you may not have a normal bowel movement for a few days.  Please Note:  You might notice some irritation and congestion in your nose or some drainage.  This is from the oxygen used during your procedure.  There is no need for concern and it should clear up in a day or so.  SYMPTOMS TO REPORT IMMEDIATELY:   Following lower endoscopy (colonoscopy or flexible sigmoidoscopy):  Excessive amounts of blood in the stool  Significant tenderness or worsening of abdominal pains  Swelling of the abdomen that is new, acute  Fever of 100F or higher   For urgent or emergent issues, a gastroenterologist can be reached at any hour by calling [phone].   DIET: Your first meal following the procedure should be a small meal and then it is ok to progress to your normal diet. Heavy or fried foods are harder to digest and may make you feel nauseous or bloated.  Likewise, meals heavy in dairy and vegetables can increase bloating.  Drink plenty of fluids but you should avoid alcoholic beverages for 24  hours.  ACTIVITY:  You should plan to take it easy for the rest of today and you should NOT DRIVE or use heavy machinery until tomorrow (because of the sedation medicines used during the test).    FOLLOW UP: Our staff will call the number listed on your records the next business day following your procedure to check on you and address any questions or concerns that you may have regarding the information given to you following your procedure. If we do not reach you, we will leave a message.  However, if you are feeling well and you are not experiencing any problems, there is no need to return our call.  We will assume that you have returned to your regular daily activities without incident.  If any biopsies were taken you will be contacted by phone or by letter within the next 1-3 weeks.  Please call us at [phone] if you have not heard about the biopsies in 3 weeks.    SIGNATURES/CONFIDENTIALITY: You and/or your care partner have signed paperwork which will be entered into your electronic medical record.  These signatures attest to the fact that that the information above on your After Visit Summary has been reviewed and is understood.  Full responsibility of the confidentiality of this discharge information lies with you and/or your care-partner. 

## 2015-09-28 NOTE — Op Note (Signed)
[place]  [address] [place], [id number]   COLONOSCOPY PROCEDURE REPORT  PATIENT: Colton Mcconnell, Colton Mcconnell  MR#: [id number] BIRTHDATE: [birth year] , 63  yrs. old GENDER: male ENDOSCOPIST: Milus Banister, MD REFERRED [id number] Larose Kells, M.D. PROCEDURE DATE:  09/28/2015 PROCEDURE:   Colonoscopy, screening and Colonoscopy with biopsy First Screening Colonoscopy - Avg.  risk and is 50 yrs.  old or older Yes.  Prior Negative Screening - Now for repeat screening. N/A  History of Adenoma - Now for follow-up colonoscopy & has been > or = to 3 yrs.  N/A  Polyps removed today? Yes ASA CLASS:   Class II INDICATIONS:Screening for colonic neoplasia and Colorectal Neoplasm Risk Assessment for this procedure is average risk. MEDICATIONS: Monitored anesthesia care and Propofol 200 mg IV  DESCRIPTION OF PROCEDURE:   After the risks benefits and alternatives of the procedure were thoroughly explained, informed consent was obtained.  The digital rectal exam revealed no abnormalities of the rectum.   The LB [id number]  endoscope was introduced through the anus and advanced to the cecum, which was identified by both the appendix and ileocecal valve. No adverse events experienced.   The quality of the prep was excellent.  The instrument was then slowly withdrawn as the colon was fully examined. Estimated blood loss is zero unless otherwise noted in this procedure report.   COLON FINDINGS: A sessile polyp measuring 2 mm in size was found in the ascending colon.  A polypectomy was performed with cold forceps.  The resection was complete, the polyp tissue was completely retrieved and sent to histology.   Melanosis coli was found throughout the entire examined colon.   The examination was otherwise normal.  Retroflexed views revealed no abnormalities. The time to cecum = 3.3 Withdrawal time = 8.9   The scope was withdrawn and the procedure completed. COMPLICATIONS: There were no immediate  complications.  ENDOSCOPIC IMPRESSION: 1.   Sessile polyp was found in the ascending colon; polypectomy was performed with cold forceps 2.   Melanosis coli was found throughout the entire examined colon (completely benign staining of the colon from laxatives) 3.   The examination was otherwise normal  RECOMMENDATIONS: If the polyp(s) removed today are proven to be adenomatous (pre-cancerous) polyps, you will need a repeat colonoscopy in 5 years.  Otherwise you should continue to follow colorectal cancer screening guidelines for "routine risk" patients with colonoscopy in 10 years.  You will receive a letter within 1-2 weeks with the results of your biopsy as well as final recommendations.  Please call my office if you have not received a letter after 3 weeks.  eSigned:  Milus Banister, MD 09/28/2015 8:30 AM

## 2015-09-28 NOTE — Progress Notes (Signed)
Report to PACU, RN, vss, BBS= Clear.  

## 2015-09-28 NOTE — Progress Notes (Signed)
Called to room to assist during endoscopic procedure.  Patient ID and intended procedure confirmed with present staff. Received instructions for my participation in the procedure from the performing physician.  

## 2015-10-01 ENCOUNTER — Telehealth: Payer: Self-pay

## 2015-10-01 NOTE — Telephone Encounter (Signed)
  Follow up Call-  Call back number 09/28/2015  Post procedure Call Back phone  # [phone]  Permission to leave phone message Yes     Patient questions:  Do you have a fever, pain , or abdominal swelling? No. Pain Score  0 *  Have you tolerated food without any problems? Yes.    Have you been able to return to your normal activities? Yes.    Do you have any questions about your discharge instructions: Diet   No. Medications  No. Follow up visit  No.  Do you have questions or concerns about your Care? No.  Actions: * If pain score is 4 or above: No action needed, pain <4.

## 2015-10-08 ENCOUNTER — Encounter: Payer: Self-pay | Admitting: Gastroenterology

## 2015-11-08 ENCOUNTER — Encounter: Payer: 59 | Admitting: Internal Medicine

## 2015-11-20 ENCOUNTER — Telehealth: Payer: Self-pay

## 2015-11-20 NOTE — Telephone Encounter (Signed)
Pre visit call made. Left message for return call.

## 2015-11-21 ENCOUNTER — Ambulatory Visit (INDEPENDENT_AMBULATORY_CARE_PROVIDER_SITE_OTHER): Payer: 59 | Admitting: Internal Medicine

## 2015-11-21 ENCOUNTER — Encounter: Payer: Self-pay | Admitting: Internal Medicine

## 2015-11-21 VITALS — BP 118/78 | HR 69 | Temp 98.0°F | Ht 66.0 in | Wt 187.1 lb

## 2015-11-21 DIAGNOSIS — Z0000 Encounter for general adult medical examination without abnormal findings: Secondary | ICD-10-CM

## 2015-11-21 DIAGNOSIS — Z125 Encounter for screening for malignant neoplasm of prostate: Secondary | ICD-10-CM

## 2015-11-21 MED ORDER — LORAZEPAM 1 MG PO TABS: 1.0000 mg | ORAL_TABLET | Freq: Three times a day (TID) | ORAL | Status: DC | PRN

## 2015-11-21 NOTE — Patient Instructions (Signed)
BEFORE YOU LEAVE THE OFFICE: GO TO THE LAB please provide a urine sample for a UDS  GO TO THE FRONT DESK Schedule labs to be done within few days (fasting)    Schedule a complete physical exam to be done in 1 year Please be fasting

## 2015-11-21 NOTE — Progress Notes (Signed)
Pre visit review using our clinic review tool, if applicable. No additional management support is needed unless otherwise documented below in the visit note. 

## 2015-11-21 NOTE — Progress Notes (Signed)
Subjective:    Patient ID: Colton Mcconnell, male    DOB: January 08, 1961, 55 y.o.   MRN: [id number]  DOS:  11/21/2015 Type of visit - description : CPX Interval history: Doing well, no major concerns   Review of Systems  Constitutional: No fever. No chills. No unexplained wt changes. No unusual sweats  HEENT: No dental problems, no ear discharge, no facial swelling, no voice changes. No eye discharge, no eye  redness , no  intolerance to light   Respiratory: No wheezing , no  difficulty breathing. No cough , no mucus production  Cardiovascular: No CP, no leg swelling , no  Palpitations  GI: no nausea, no vomiting, no diarrhea , no  abdominal pain.  No blood in the stools. No dysphagia, no odynophagia    Endocrine: No polyphagia, no polyuria , no polydipsia  GU: No dysuria, gross hematuria, difficulty urinating. No urinary urgency, no frequency.  Musculoskeletal: No joint swellings or unusual aches or pains  Skin: No change in the color of the skin, palor. Small area of rash on the right armpit,  itchy. No painful no blisters  Allergic, immunologic: No environmental allergies , no  food allergies  Neurological: No dizziness no  syncope. No headaches. No diplopia, no slurred, no slurred speech, no motor deficits, no facial  Numbness  Hematological: No enlarged lymph nodes, no easy bruising , no unusual bleedings  Psychiatry: No suicidal ideas, no hallucinations, no beavior problems, no confusion.  No unusual/severe anxiety, no depression , good compliance with medications, symptoms controlled  Past Medical History  Diagnosis Date  . Anxiety   . Erectile dysfunction   . Back pain     sporadic  . Arthritis     neck, lower back  . Maxillary sinusitis     Recurrent  . Spinal stenosis     Past Surgical History  Procedure Laterality Date  . Back surgery      cysts removed from spine age 89 and 16  . Colonoscopy      Social History   Social History  . Marital Status:  Married    Spouse Name: N/A  . Number of Children: 2  . Years of Education: N/A   Occupational History  . Insurance claims handler    Social History Main Topics  . Smoking status: Never Smoker   . Smokeless tobacco: Never Used  . Alcohol Use: 6.0 oz/week    10 Cans of beer per week     Comment: used to be a heavy drinker, now drinks beer on weekends   . Drug Use: No  . Sexual Activity: Yes   Other Topics Concern  . Not on file   Social History Narrative   2 boys, 2nd wife has 2 daughters.     Family History  Problem Relation Age of Onset  . CAD Father     cabg at age 54  . Hypertension Father   . Diabetes Father   . Colon cancer Neg Hx   . Prostate cancer Neg Hx   . Colon polyps Neg Hx   . Rectal cancer Neg Hx   . Stomach cancer Neg Hx   . Esophageal cancer Neg Hx   . Breast cancer Mother   . Alcohol abuse Other     many family members        Medication List       This list is accurate as of: 11/21/15 11:59 PM.  Always use your most recent med list.               FLUoxetine 40 MG capsule  Commonly known as:  PROZAC  Take 1 capsule (40 mg total) by mouth daily.     HYDROcodone-acetaminophen 5-325 MG tablet  Commonly known as:  NORCO/VICODIN  Take 1 tablet by mouth every 6 (six) hours as needed for moderate pain. Prescribed elsewhere     lamoTRIgine 100 MG tablet  Commonly known as:  LAMICTAL  Take 1 tablet (100 mg total) by mouth 2 (two) times daily.     LORazepam 1 MG tablet  Commonly known as:  ATIVAN  Take 1 tablet (1 mg total) by mouth 3 (three) times daily as needed for anxiety.     zaleplon 10 MG capsule  Commonly known as:  SONATA  Take 1 capsule (10 mg total) by mouth at bedtime as needed for sleep. When traveling           Objective:   Physical Exam BP 118/78 mmHg  Pulse 69  Temp(Src) 98 F (36.7 C) (Oral)  Ht 5\' 6"  (1.676 m)  Wt 187 lb 2 oz (84.879 kg)  BMI 30.22 kg/m2  SpO2 94% General:   Well developed,  well nourished . NAD.  Neck:   No  Thyromegaly  HEENT:  Normocephalic . Face symmetric, atraumatic Lungs:  CTA B Normal respiratory effort, no intercostal retractions, no accessory muscle use. Heart: RRR,  no murmur.  No pretibial edema bilaterally  Abdomen:  Not distended, soft, non-tender. No rebound or rigidity.  Rectal:  External abnormalities: none. Normal sphincter tone. No rectal masses or tenderness.  No stools  Prostate: Prostate gland firm and smooth, no enlargement, nodularity, tenderness, mass, asymmetry or induration.  Skin: Exposed areas without rash. Not pale. Not jaundice Neurologic:  alert & oriented X3.  Speech normal, gait appropriate for age and unassisted Strength symmetric and appropriate for age.  Psych: Cognition and judgment appear intact.  Cooperative with normal attention span and concentration.  Behavior appropriate. No anxious or depressed appearing.    Assessment & Plan:   Assessment Prediabetes A1c 5.9 (06-2013) Anxiety  Use to see Dr. Candis Schatz, was unable to find a new psychiatrist ; now meds rx by pcp (since 2016) [facility] Back pain, saw Ortho 2016, had an MRI, dx spinal stenosis per pt declined epidurals, on  vicodin prn GU: --ED  --H/o low testost, dx per urology, declined HRT or more testing --H/o Peyronie's Dz  DX@urology  Sees derm regularly , had few lesions removed   Plan: Prediabetes: Check labs Anxiety: Well controlled with current medications, refill. Takes ativan from 1 to 3 tabs qd prn . Get a UDS , contract signed. MSK: Occasional takes Vicodin prescribed by orthopedics. History of low testosterone: States he won't take any HRT, declined to recheck levels today History of Peyronie's Dz, has a curved erection, recommend to see urology again if symptoms bothersome RTC  1 year

## 2015-11-21 NOTE — Assessment & Plan Note (Signed)
Td ~ 2010 per pt; zostavax-- discussed before Flu shot, declined again    Cscope at age 55 per pt, again 09-2015, 5 years  Prostate ca screening : DRE normal, check a PSA Diet-exercise discussed

## 2015-11-22 ENCOUNTER — Telehealth: Payer: Self-pay | Admitting: Internal Medicine

## 2015-11-22 MED ORDER — FLUOXETINE HCL 40 MG PO CAPS: 40.0000 mg | ORAL_CAPSULE | Freq: Every day | ORAL | Status: DC

## 2015-11-22 MED ORDER — LAMOTRIGINE 100 MG PO TABS: 100.0000 mg | ORAL_TABLET | Freq: Two times a day (BID) | ORAL | Status: DC

## 2015-11-22 NOTE — Telephone Encounter (Signed)
Relation to [id number] Call back [phone] Pharmacy:   Reason for call:  Patient requesting a refill FLUoxetine (PROZAC) 40 MG capsule and lamoTRIgine (LAMICTAL) 100 MG

## 2015-11-22 NOTE — Telephone Encounter (Signed)
Rxs sent

## 2015-11-30 ENCOUNTER — Telehealth: Payer: Self-pay

## 2015-11-30 NOTE — Telephone Encounter (Signed)
UDS:11/21/2015   Positive for Lorazepam   Low risk per Dr. Larose Kells 11/30/2015

## 2015-12-31 ENCOUNTER — Encounter: Payer: Self-pay | Admitting: Internal Medicine

## 2016-03-17 ENCOUNTER — Other Ambulatory Visit: Payer: Self-pay | Admitting: Internal Medicine

## 2016-03-28 NOTE — Telephone Encounter (Signed)
Pre Visit Call. 

## 2016-04-04 ENCOUNTER — Telehealth: Payer: Self-pay | Admitting: Internal Medicine

## 2016-04-04 MED ORDER — LORAZEPAM 1 MG PO TABS: 1.0000 mg | ORAL_TABLET | Freq: Three times a day (TID) | ORAL | Status: DC | PRN

## 2016-04-04 NOTE — Telephone Encounter (Signed)
Ok 90 and 3 RF

## 2016-04-04 NOTE — Telephone Encounter (Signed)
Rx printed, awaiting MD signature.  

## 2016-04-04 NOTE — Telephone Encounter (Signed)
Refill request for LORazepam - pt says that he just learned that he will be out of town for work in [place] and would like to have Rx .   [facility]acy: [id number] - [place], [place]

## 2016-04-04 NOTE — Telephone Encounter (Signed)
[facility]  

## 2016-04-04 NOTE — Telephone Encounter (Signed)
Pt is requesting refill on Lorazepam.  Last OV: 11/21/2015 Last Fill: 11/21/2015 #90 and 3RF Pt sig: 1 tablet TID PRN UDS: 11/21/2015 Low risk  Please advise.

## 2016-04-21 ENCOUNTER — Other Ambulatory Visit: Payer: Self-pay | Admitting: Internal Medicine

## 2016-08-18 ENCOUNTER — Telehealth: Payer: Self-pay | Admitting: Internal Medicine

## 2016-08-18 MED ORDER — FLUOXETINE HCL 40 MG PO CAPS: 40.0000 mg | ORAL_CAPSULE | Freq: Every day | ORAL | 1 refills | Status: DC

## 2016-08-18 MED ORDER — LAMOTRIGINE 100 MG PO TABS: 100.0000 mg | ORAL_TABLET | Freq: Two times a day (BID) | ORAL | 1 refills | Status: DC

## 2016-08-18 NOTE — Telephone Encounter (Signed)
Pt is requesting refill on Lorazepam.  Last OV: 11/21/2015, no upcoming appt scheduled Last Fill: 04/04/2016 #90 and 3RF UDS: 11/21/2015 Low risk  Please advise. (Rx's for Fluoxetine, Lamictal sent to pharamcy).

## 2016-08-18 NOTE — Telephone Encounter (Signed)
Ok 90, no RF . Due for CPX ~ 10-2016, set up an appointment

## 2016-08-18 NOTE — Telephone Encounter (Signed)
Self. LORazepam, LAMICTAL, FLUoxetine    [id number] [place], [place]

## 2016-08-19 MED ORDER — LORAZEPAM 1 MG PO TABS: 1.0000 mg | ORAL_TABLET | Freq: Three times a day (TID) | ORAL | 1 refills | Status: DC | PRN

## 2016-08-19 NOTE — Telephone Encounter (Signed)
[facility] [address]

## 2016-08-19 NOTE — Telephone Encounter (Signed)
Rx printed, awaiting MD signature.  

## 2016-10-07 ENCOUNTER — Other Ambulatory Visit: Payer: Self-pay | Admitting: Rehabilitation

## 2016-10-07 DIAGNOSIS — M5106 Intervertebral disc disorders with myelopathy, lumbar region: Secondary | ICD-10-CM

## 2016-10-30 ENCOUNTER — Ambulatory Visit
Admission: RE | Admit: 2016-10-30 | Discharge: 2016-10-30 | Disposition: A | Discharge status: Home / Self Care | Payer: 59 | Source: Ambulatory Visit | Attending: Rehabilitation | Admitting: Rehabilitation

## 2016-10-30 DIAGNOSIS — M48061 Spinal stenosis, lumbar region without neurogenic claudication: Secondary | ICD-10-CM | POA: Diagnosis not present

## 2016-10-30 DIAGNOSIS — M5106 Intervertebral disc disorders with myelopathy, lumbar region: Secondary | ICD-10-CM

## 2016-10-30 MED ORDER — GADOBENATE DIMEGLUMINE 529 MG/ML IV SOLN
17.0000 mL | Freq: Once | INTRAVENOUS | Status: AC | PRN
  Administered 2016-10-30: 17 mL via INTRAVENOUS

## 2016-11-03 ENCOUNTER — Other Ambulatory Visit: Payer: Self-pay | Admitting: Internal Medicine

## 2016-11-03 NOTE — Telephone Encounter (Signed)
Rx printed, awaiting MD signature.  

## 2016-11-03 NOTE — Telephone Encounter (Signed)
[facility]. [place]-Pt due for CPE, please call and schedule appt at his convenience. Thank you.

## 2016-11-03 NOTE — Telephone Encounter (Signed)
Pt is requesting refill on Lorazepam.  Last OV: 11/21/2015 Last Fill: 08/19/2016 #90 and 1RF (Pt sig: 1 tab tid prn) UDS: 11/21/2015 Low risk  Please advise.

## 2016-11-03 NOTE — Telephone Encounter (Signed)
Okay 90, no refills. Needs a CPX-please arrange

## 2016-11-04 NOTE — Telephone Encounter (Signed)
Pt has been scheduled.  °

## 2016-11-07 DIAGNOSIS — M4316 Spondylolisthesis, lumbar region: Secondary | ICD-10-CM | POA: Diagnosis not present

## 2016-11-07 DIAGNOSIS — M47816 Spondylosis without myelopathy or radiculopathy, lumbar region: Secondary | ICD-10-CM | POA: Diagnosis not present

## 2016-11-07 DIAGNOSIS — M5416 Radiculopathy, lumbar region: Secondary | ICD-10-CM | POA: Diagnosis not present

## 2016-11-26 DIAGNOSIS — M47816 Spondylosis without myelopathy or radiculopathy, lumbar region: Secondary | ICD-10-CM | POA: Diagnosis not present

## 2016-11-26 DIAGNOSIS — M4316 Spondylolisthesis, lumbar region: Secondary | ICD-10-CM | POA: Diagnosis not present

## 2016-11-26 DIAGNOSIS — M5416 Radiculopathy, lumbar region: Secondary | ICD-10-CM | POA: Diagnosis not present

## 2016-12-09 ENCOUNTER — Encounter: Payer: Self-pay | Admitting: Internal Medicine

## 2016-12-09 ENCOUNTER — Ambulatory Visit (INDEPENDENT_AMBULATORY_CARE_PROVIDER_SITE_OTHER): Payer: 59 | Admitting: Internal Medicine

## 2016-12-09 VITALS — BP 108/76 | HR 79 | Temp 97.9°F | Resp 12 | Ht 66.0 in | Wt 201.5 lb

## 2016-12-09 DIAGNOSIS — R739 Hyperglycemia, unspecified: Secondary | ICD-10-CM

## 2016-12-09 DIAGNOSIS — Z79899 Other long term (current) drug therapy: Secondary | ICD-10-CM | POA: Diagnosis not present

## 2016-12-09 DIAGNOSIS — Z0000 Encounter for general adult medical examination without abnormal findings: Secondary | ICD-10-CM

## 2016-12-09 DIAGNOSIS — Z1159 Encounter for screening for other viral diseases: Secondary | ICD-10-CM

## 2016-12-09 MED ORDER — LORAZEPAM 1 MG PO TABS: 1.0000 mg | ORAL_TABLET | Freq: Three times a day (TID) | ORAL | 0 refills | Status: DC | PRN

## 2016-12-09 NOTE — Assessment & Plan Note (Addendum)
Td ~ 2010 per pt; zostavax-- discussed before Flu shot, declined again    Cscope at age 56 per pt, again 09-2015, 5 years  Prostate ca screening : DRE normal last year, check a PSA Labs recommended last year not done. Will come back tomorrow fasting: CMP, CBC, TSH, PSA, A1c, hep  C Diet discussed. Exercise very limited d/t back pain

## 2016-12-09 NOTE — Progress Notes (Signed)
Subjective:    Patient ID: Colton Mcconnell, male    DOB: 1961/07/15, 56 y.o.   MRN: [id number]  DOS:  12/09/2016 Type of visit - description : CPX Interval history: Since the last year, back pain got worse, under the care off Dr. Patrice Paradise, plan to do surgery in few weeks    Review of Systems Constitutional: No fever. No chills. No unexplained wt changes. No unusual sweats  HEENT: No dental problems, no ear discharge, no facial swelling, no voice changes. No eye discharge, no eye  redness , no  intolerance to light   Respiratory: No wheezing , no  difficulty breathing. No cough , no mucus production  Cardiovascular: No CP, no leg swelling , no  Palpitations  GI: no nausea, no vomiting, no diarrhea , no  abdominal pain.  No blood in the stools. No dysphagia, no odynophagia    Endocrine: No polyphagia, no polyuria , no polydipsia  GU: No dysuria, gross hematuria, difficulty urinating. No urinary urgency, no frequency.  Musculoskeletal: Chronic back pain, to have surgery in few weeks  Skin: No change in the color of the skin, palor , no  Rash  Allergic, immunologic: No environmental allergies , no  food allergies  Neurological: No dizziness no  syncope. No headaches. No diplopia, no slurred, no slurred speech, no motor deficits, no facial  Numbness  Hematological: No enlarged lymph nodes, no easy bruising , no unusual bleedings  Psychiatry: No suicidal ideas, no hallucinations, no beavior problems, no confusion.  No unusual/severe anxiety, no depression   Past Medical History:  Diagnosis Date  . Anxiety   . Arthritis    neck, lower back  . Back pain    sporadic  . Disc degeneration, lumbosacral    at 4-5, 5-1  . Erectile dysfunction   . Facet arthritis of lumbosacral region ([place])    at 4-5 and neural foraminal stenosis  . Maxillary sinusitis    Recurrent  . 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 BACK SURGERY     cysts removed from spine age 87 and 50  . COLONOSCOPY      Social History   Social History  . Marital status: Married    Spouse name: N/A  . Number of children: 2  . Years of education: N/A   Occupational History  . Insurance claims handler    Social History Main Topics  . Smoking status: Never Smoker  . Smokeless tobacco: Never Used  . Alcohol use No     Comment: none since 08-2016  . Drug use: No  . Sexual activity: Yes   Other Topics Concern  . Not on file   Social History Narrative   2 boys- adults   2nd wife has 2 daughters, independent      Family History  Problem Relation Age of Onset  . CAD Father     cabg at age 28  . Hypertension Father   . Diabetes Father   . Breast cancer Mother   . Alcohol abuse Other     many family members   . Colon cancer Neg Hx   . Prostate cancer Neg Hx   . Colon polyps Neg Hx   . Rectal cancer Neg Hx   . Stomach cancer Neg Hx   . Esophageal cancer Neg Hx  Allergies as of 12/09/2016   No Known Allergies     Medication List       Accurate as of 12/09/16 11:59 PM. Always use your most recent med list.          FLUoxetine 40 MG capsule Commonly known as:  PROZAC Take 1 capsule (40 mg total) by mouth daily.   gabapentin 300 MG capsule Commonly known as:  NEURONTIN Take 600 mg by mouth 3 (three) times daily as needed.   HYDROcodone-acetaminophen 5-325 MG tablet Commonly known as:  NORCO/VICODIN Take 1 tablet by mouth every 6 (six) hours as needed for moderate pain. Prescribed elsewhere   lamoTRIgine 100 MG tablet Commonly known as:  LAMICTAL Take 1 tablet (100 mg total) by mouth 2 (two) times daily.   LORazepam 1 MG tablet Commonly known as:  ATIVAN Take 1 tablet (1 mg total) by mouth 3 (three) times daily as needed for anxiety.   zaleplon 10 MG capsule Commonly known as:  SONATA Take 1 capsule (10 mg total) by mouth at bedtime as needed for sleep.  When traveling          Objective:   Physical Exam BP 108/76 (BP Location: Left Arm, Patient Position: Standing, Cuff Size: Normal)   Pulse 79   Temp 97.9 F (36.6 C) (Oral)   Resp 12   Ht 5\' 6"  (1.676 m)   Wt 201 lb 8 oz (91.4 kg)   SpO2 99%   BMI 32.52 kg/m   General:   Well developed, well nourished . NAD.  HEENT:  Normocephalic . Face symmetric, atraumatic Lungs:  CTA B Normal respiratory effort, no intercostal retractions, no accessory muscle use. Heart: RRR,  no murmur.  No pretibial edema bilaterally  Abdomen:  Not distended, soft, non-tender. No rebound or rigidity.   Skin: Exposed areas without rash. Not pale. Not jaundice Neurologic:  alert & oriented X3.  Speech normal, gait and posture limited by back pain. unassisted. Needs some help when he gets up from the examining table Strength symmetric and appropriate for age.  Psych: Cognition and judgment appear intact.  Cooperative with normal attention span and concentration.  Behavior appropriate. No anxious or depressed appearing.    Assessment & Plan:   Assessment Prediabetes A1c 5.9 (06-2013) Anxiety  Use to see Dr. Candis Schatz, was unable to find a new psychiatrist ; now meds rx by pcp (since 2016) [facility] Back pain, saw Ortho 2016, had an MRI, dx spinal stenosis per pt declined epidurals, on  vicodin prn, Dr Patrice Paradise GU: --ED  --H/o low testost, dx per urology, declined HRT or more testing --H/o Peyronie's Dz  DX@urology  Sees derm regularly , had few lesions removed   Plan: Prediabetes: Check A1c Anxiety:  controlled, refill Prozac, Lamictal, Ativan. Contract and a UDS today. RFs Back pain: Over the last year the back pain got worse, s/p local injections without help, scheduled to have surgery next month. Currently on gabapentin and hydrocodone prescribed elsewhere. Peryonie Dz-- Getting worse, plan to address with urology once back surgery is over RTC  6 months

## 2016-12-09 NOTE — Patient Instructions (Signed)
GO TO THE LAB : Provide a urine sample for a UDS     GO TO THE FRONT DESK Schedule your next appointment for a  routine checkup in 6 months  Schedule labs to be done this week, fasting

## 2016-12-09 NOTE — Progress Notes (Signed)
Pre visit review using our clinic review tool, if applicable. No additional management support is needed unless otherwise documented below in the visit note. 

## 2016-12-10 ENCOUNTER — Other Ambulatory Visit: Payer: 59

## 2016-12-10 DIAGNOSIS — Z09 Encounter for follow-up examination after completed treatment for conditions other than malignant neoplasm: Secondary | ICD-10-CM | POA: Insufficient documentation

## 2016-12-10 NOTE — Assessment & Plan Note (Signed)
Prediabetes: Check A1c Anxiety:  controlled, refill Prozac, Lamictal, Ativan. Contract and a UDS today. RFs Back pain: Over the last year the back pain got worse, s/p local injections without help, scheduled to have surgery next month. Currently on gabapentin and hydrocodone prescribed elsewhere. Peryonie Dz-- Getting worse, plan to address with urology once back surgery is over RTC  6 months

## 2016-12-11 DIAGNOSIS — M4317 Spondylolisthesis, lumbosacral region: Secondary | ICD-10-CM | POA: Insufficient documentation

## 2016-12-11 DIAGNOSIS — M4316 Spondylolisthesis, lumbar region: Secondary | ICD-10-CM | POA: Diagnosis not present

## 2016-12-11 DIAGNOSIS — M961 Postlaminectomy syndrome, not elsewhere classified: Secondary | ICD-10-CM | POA: Diagnosis not present

## 2016-12-12 ENCOUNTER — Other Ambulatory Visit (INDEPENDENT_AMBULATORY_CARE_PROVIDER_SITE_OTHER): Payer: 59

## 2016-12-12 DIAGNOSIS — M545 Low back pain: Secondary | ICD-10-CM | POA: Diagnosis not present

## 2016-12-12 DIAGNOSIS — M47816 Spondylosis without myelopathy or radiculopathy, lumbar region: Secondary | ICD-10-CM | POA: Diagnosis not present

## 2016-12-12 DIAGNOSIS — Z4689 Encounter for fitting and adjustment of other specified devices: Secondary | ICD-10-CM | POA: Diagnosis not present

## 2016-12-12 DIAGNOSIS — Z0000 Encounter for general adult medical examination without abnormal findings: Secondary | ICD-10-CM

## 2016-12-12 DIAGNOSIS — M5106 Intervertebral disc disorders with myelopathy, lumbar region: Secondary | ICD-10-CM | POA: Diagnosis not present

## 2016-12-12 DIAGNOSIS — Z1159 Encounter for screening for other viral diseases: Secondary | ICD-10-CM

## 2016-12-12 DIAGNOSIS — R739 Hyperglycemia, unspecified: Secondary | ICD-10-CM | POA: Diagnosis not present

## 2016-12-12 DIAGNOSIS — M4316 Spondylolisthesis, lumbar region: Secondary | ICD-10-CM | POA: Diagnosis not present

## 2016-12-12 LAB — CBC WITH DIFFERENTIAL/PLATELET
BASOS PCT: 0.6 % (ref 0.0–3.0)
Basophils Absolute: 0 10*3/uL (ref 0.0–0.1)
EOS PCT: 2.7 % (ref 0.0–5.0)
Eosinophils Absolute: 0.1 10*3/uL (ref 0.0–0.7)
HEMATOCRIT: 43.2 % (ref 39.0–52.0)
HEMOGLOBIN: 14.7 g/dL (ref 13.0–17.0)
Lymphocytes Relative: 33 % (ref 12.0–46.0)
Lymphs Abs: 1.6 10*3/uL (ref 0.7–4.0)
MCHC: 33.9 g/dL (ref 30.0–36.0)
MCV: 90.6 fl (ref 78.0–100.0)
MONO ABS: 0.5 10*3/uL (ref 0.1–1.0)
Monocytes Relative: 10 % (ref 3.0–12.0)
Neutro Abs: 2.6 10*3/uL (ref 1.4–7.7)
Neutrophils Relative %: 53.7 % (ref 43.0–77.0)
Platelets: 193 10*3/uL (ref 150.0–400.0)
RBC: 4.77 Mil/uL (ref 4.22–5.81)
RDW: 13 % (ref 11.5–15.5)
WBC: 4.8 10*3/uL (ref 4.0–10.5)

## 2016-12-12 LAB — LIPID PANEL
Cholesterol: 187 mg/dL (ref 0–200)
HDL: 43.5 mg/dL (ref >39.00)
LDL Cholesterol: 115 mg/dL — ABNORMAL HIGH (ref 0–99)
NonHDL: 143.08
Total CHOL/HDL Ratio: 4
Triglycerides: 138 mg/dL (ref 0.0–149.0)
VLDL: 27.6 mg/dL (ref 0.0–40.0)

## 2016-12-12 LAB — COMPREHENSIVE METABOLIC PANEL
ALBUMIN: 4.7 g/dL (ref 3.5–5.2)
ALK PHOS: 51 U/L (ref 39–117)
ALT: 31 U/L (ref 0–53)
AST: 24 U/L (ref 0–37)
BILIRUBIN TOTAL: 0.4 mg/dL (ref 0.2–1.2)
BUN: 19 mg/dL (ref 6–23)
CO2: 28 mEq/L (ref 19–32)
Calcium: 9.6 mg/dL (ref 8.4–10.5)
Chloride: 103 mEq/L (ref 96–112)
Creatinine, Ser: 1.12 mg/dL (ref 0.40–1.50)
GFR: 71.8 mL/min (ref >60.00)
Glucose, Bld: 100 mg/dL — ABNORMAL HIGH (ref 70–99)
POTASSIUM: 4.3 meq/L (ref 3.5–5.1)
Sodium: 139 mEq/L (ref 135–145)
TOTAL PROTEIN: 6.9 g/dL (ref 6.0–8.3)

## 2016-12-12 LAB — TSH: TSH: 1.71 u[IU]/mL (ref 0.35–4.50)

## 2016-12-12 LAB — HEPATITIS C ANTIBODY: HCV AB: NEGATIVE

## 2016-12-12 LAB — HEMOGLOBIN A1C: Hgb A1c MFr Bld: 5.9 % (ref 4.6–6.5)

## 2016-12-12 LAB — PSA: PSA: 0.31 ng/mL (ref 0.10–4.00)

## 2016-12-15 DIAGNOSIS — M4186 Other forms of scoliosis, lumbar region: Secondary | ICD-10-CM | POA: Diagnosis not present

## 2016-12-15 DIAGNOSIS — M961 Postlaminectomy syndrome, not elsewhere classified: Secondary | ICD-10-CM | POA: Diagnosis not present

## 2016-12-15 DIAGNOSIS — M5136 Other intervertebral disc degeneration, lumbar region: Secondary | ICD-10-CM | POA: Diagnosis not present

## 2016-12-15 DIAGNOSIS — Z981 Arthrodesis status: Secondary | ICD-10-CM | POA: Diagnosis not present

## 2016-12-15 DIAGNOSIS — M48062 Spinal stenosis, lumbar region with neurogenic claudication: Secondary | ICD-10-CM | POA: Diagnosis not present

## 2016-12-15 DIAGNOSIS — M4316 Spondylolisthesis, lumbar region: Secondary | ICD-10-CM | POA: Diagnosis not present

## 2016-12-15 DIAGNOSIS — M5106 Intervertebral disc disorders with myelopathy, lumbar region: Secondary | ICD-10-CM | POA: Diagnosis not present

## 2016-12-20 DIAGNOSIS — M4316 Spondylolisthesis, lumbar region: Secondary | ICD-10-CM | POA: Diagnosis not present

## 2016-12-20 DIAGNOSIS — M961 Postlaminectomy syndrome, not elsewhere classified: Secondary | ICD-10-CM | POA: Diagnosis not present

## 2016-12-20 DIAGNOSIS — R2681 Unsteadiness on feet: Secondary | ICD-10-CM | POA: Diagnosis not present

## 2016-12-22 DIAGNOSIS — R2681 Unsteadiness on feet: Secondary | ICD-10-CM | POA: Diagnosis not present

## 2016-12-22 DIAGNOSIS — M961 Postlaminectomy syndrome, not elsewhere classified: Secondary | ICD-10-CM | POA: Diagnosis not present

## 2016-12-22 DIAGNOSIS — M4316 Spondylolisthesis, lumbar region: Secondary | ICD-10-CM | POA: Diagnosis not present

## 2016-12-25 DIAGNOSIS — M4316 Spondylolisthesis, lumbar region: Secondary | ICD-10-CM | POA: Diagnosis not present

## 2016-12-25 DIAGNOSIS — M961 Postlaminectomy syndrome, not elsewhere classified: Secondary | ICD-10-CM | POA: Diagnosis not present

## 2016-12-25 DIAGNOSIS — R2681 Unsteadiness on feet: Secondary | ICD-10-CM | POA: Diagnosis not present

## 2016-12-29 DIAGNOSIS — M545 Low back pain: Secondary | ICD-10-CM | POA: Diagnosis not present

## 2016-12-29 DIAGNOSIS — M5106 Intervertebral disc disorders with myelopathy, lumbar region: Secondary | ICD-10-CM | POA: Diagnosis not present

## 2016-12-29 DIAGNOSIS — M4326 Fusion of spine, lumbar region: Secondary | ICD-10-CM | POA: Diagnosis not present

## 2016-12-31 ENCOUNTER — Telehealth: Payer: Self-pay

## 2016-12-31 NOTE — Telephone Encounter (Signed)
UDS: 12/09/2016  Positive for Lorazepam Positive for Oxycodone-was prescribed elsewhere  Low risk per PCP 12/31/2016

## 2017-01-13 DIAGNOSIS — M7062 Trochanteric bursitis, left hip: Secondary | ICD-10-CM | POA: Diagnosis not present

## 2017-01-13 DIAGNOSIS — M549 Dorsalgia, unspecified: Secondary | ICD-10-CM | POA: Diagnosis not present

## 2017-01-14 DIAGNOSIS — M4316 Spondylolisthesis, lumbar region: Secondary | ICD-10-CM | POA: Diagnosis not present

## 2017-01-14 DIAGNOSIS — R2681 Unsteadiness on feet: Secondary | ICD-10-CM | POA: Diagnosis not present

## 2017-01-14 DIAGNOSIS — M961 Postlaminectomy syndrome, not elsewhere classified: Secondary | ICD-10-CM | POA: Diagnosis not present

## 2017-01-28 ENCOUNTER — Other Ambulatory Visit: Payer: Self-pay | Admitting: Internal Medicine

## 2017-01-28 NOTE — Telephone Encounter (Signed)
Pt is requesting refill on Lorazepam.  Last OV: 12/09/2016 Last Fill: 12/09/2016 #90 and 0RF (Pt sig: 1 tab TID PRN) UDS: 12/09/2016 Low risk  Please advise.

## 2017-01-28 NOTE — Telephone Encounter (Signed)
Rx printed, awaiting MD signature.  

## 2017-01-28 NOTE — Telephone Encounter (Signed)
Okay 90, 2 refill

## 2017-01-28 NOTE — Telephone Encounter (Signed)
[facility]  

## 2017-01-30 ENCOUNTER — Other Ambulatory Visit: Payer: Self-pay | Admitting: Internal Medicine

## 2017-02-07 ENCOUNTER — Other Ambulatory Visit: Payer: Self-pay | Admitting: Internal Medicine

## 2017-03-11 DIAGNOSIS — M4326 Fusion of spine, lumbar region: Secondary | ICD-10-CM | POA: Diagnosis not present

## 2017-04-09 DIAGNOSIS — M545 Low back pain: Secondary | ICD-10-CM | POA: Diagnosis not present

## 2017-04-09 DIAGNOSIS — M47816 Spondylosis without myelopathy or radiculopathy, lumbar region: Secondary | ICD-10-CM | POA: Diagnosis not present

## 2017-04-09 DIAGNOSIS — M5106 Intervertebral disc disorders with myelopathy, lumbar region: Secondary | ICD-10-CM | POA: Diagnosis not present

## 2017-04-15 DIAGNOSIS — M47816 Spondylosis without myelopathy or radiculopathy, lumbar region: Secondary | ICD-10-CM | POA: Diagnosis not present

## 2017-04-15 DIAGNOSIS — M5126 Other intervertebral disc displacement, lumbar region: Secondary | ICD-10-CM | POA: Diagnosis not present

## 2017-04-15 DIAGNOSIS — M47817 Spondylosis without myelopathy or radiculopathy, lumbosacral region: Secondary | ICD-10-CM | POA: Diagnosis not present

## 2017-04-16 DIAGNOSIS — M5416 Radiculopathy, lumbar region: Secondary | ICD-10-CM | POA: Diagnosis not present

## 2017-04-16 DIAGNOSIS — M5106 Intervertebral disc disorders with myelopathy, lumbar region: Secondary | ICD-10-CM | POA: Diagnosis not present

## 2017-04-16 DIAGNOSIS — M545 Low back pain: Secondary | ICD-10-CM | POA: Diagnosis not present

## 2017-04-16 DIAGNOSIS — M791 Myalgia: Secondary | ICD-10-CM | POA: Diagnosis not present

## 2017-05-17 ENCOUNTER — Other Ambulatory Visit: Payer: Self-pay | Admitting: Internal Medicine

## 2017-05-18 NOTE — Telephone Encounter (Signed)
[facility]  

## 2017-05-18 NOTE — Telephone Encounter (Signed)
Rx printed, awaiting MD signature.  

## 2017-05-18 NOTE — Telephone Encounter (Signed)
Pt is requesting refill on lorazepam.  Last OV: 12/09/2016 Last Fill: 01/28/2017 #90 and 2RF 1 tab tid prn UDS: 12/09/2016 Low risk  [place] Controlled Substance Database printed; will discuss w/ PCP.    Please advise.

## 2017-05-18 NOTE — Telephone Encounter (Signed)
Okay to refill lorazepam No. 90 and 2 RF. Data base reviewed, he takes oxycodone and hydrocodone prescribed by the spine and scoliosis office and Dr. Nelva Bush

## 2017-06-10 DIAGNOSIS — M4326 Fusion of spine, lumbar region: Secondary | ICD-10-CM | POA: Diagnosis not present

## 2017-06-10 DIAGNOSIS — M545 Low back pain: Secondary | ICD-10-CM | POA: Diagnosis not present

## 2017-06-10 DIAGNOSIS — M5106 Intervertebral disc disorders with myelopathy, lumbar region: Secondary | ICD-10-CM | POA: Diagnosis not present

## 2017-06-11 ENCOUNTER — Ambulatory Visit (INDEPENDENT_AMBULATORY_CARE_PROVIDER_SITE_OTHER): Payer: 59 | Admitting: Internal Medicine

## 2017-06-11 ENCOUNTER — Encounter: Payer: Self-pay | Admitting: Internal Medicine

## 2017-06-11 VITALS — BP 118/72 | HR 77 | Temp 97.7°F | Resp 14 | Ht 66.0 in | Wt 186.0 lb

## 2017-06-11 DIAGNOSIS — R358 Other polyuria: Secondary | ICD-10-CM

## 2017-06-11 DIAGNOSIS — R3589 Other polyuria: Secondary | ICD-10-CM

## 2017-06-11 DIAGNOSIS — F419 Anxiety disorder, unspecified: Secondary | ICD-10-CM | POA: Diagnosis not present

## 2017-06-11 DIAGNOSIS — R739 Hyperglycemia, unspecified: Secondary | ICD-10-CM

## 2017-06-11 LAB — URINALYSIS, ROUTINE W REFLEX MICROSCOPIC
BILIRUBIN URINE: NEGATIVE
Hgb urine dipstick: NEGATIVE
Ketones, ur: NEGATIVE
Leukocytes, UA: NEGATIVE
NITRITE: NEGATIVE
PH: 6 (ref 5.0–8.0)
RBC / HPF: NONE SEEN (ref 0–?)
SPECIFIC GRAVITY, URINE: 1.02 (ref 1.000–1.030)
TOTAL PROTEIN, URINE-UPE24: NEGATIVE
Urine Glucose: NEGATIVE
Urobilinogen, UA: 0.2 (ref 0.0–1.0)
WBC, UA: NONE SEEN (ref 0–?)

## 2017-06-11 LAB — HEMOGLOBIN A1C: Hgb A1c MFr Bld: 6 % (ref 4.6–6.5)

## 2017-06-11 NOTE — Progress Notes (Signed)
Subjective:    Patient ID: Colton Mcconnell, male    DOB: June 15, 1961, 56 y.o.   MRN: [id number]  DOS:  06/11/2017 Type of visit - description : rov Interval history: Back pain: Had surgery, doing great. Has been able to be more active, trying to eat healthier. Has lost wt, > 10 pounds per his scales. He also reports polyuria for the last 4 weeks although he admits that has been purposely drinking more water and has increased his caffeine intake. Emotionally doing well  Wt Readings from Last 3 Encounters:  06/11/17 186 lb (84.4 kg)  12/09/16 201 lb 8 oz (91.4 kg)  11/21/15 187 lb 2 oz (84.9 kg)     Review of Systems Denies dysuria, gross hematuria difficulty urinating  Past Medical History:  Diagnosis Date  . Anxiety   . Arthritis    neck, lower back  . Back pain    sporadic  . Disc degeneration, lumbosacral    at 4-5, 5-1  . Erectile dysfunction   . Facet arthritis of lumbosacral region ([place])    at 4-5 and neural foraminal stenosis  . Maxillary sinusitis    Recurrent  . 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 BACK SURGERY     cysts removed from spine age 41 and 51  . COLONOSCOPY      Social History   Social History  . Marital status: Married    Spouse name: N/A  . Number of children: 2  . Years of education: N/A   Occupational History  . Insurance claims handler    Social History Main Topics  . Smoking status: Never Smoker  . Smokeless tobacco: Never Used  . Alcohol use No     Comment: none since 08-2016  . Drug use: No  . Sexual activity: Yes   Other Topics Concern  . Not on file   Social History Narrative   2 boys- adults   2nd wife has 2 daughters, independent       Allergies as of 06/11/2017   No Known Allergies     Medication List       Accurate as of 06/11/17  1:45 PM. Always use your most recent med list.          FLUoxetine 40  MG capsule Commonly known as:  PROZAC Take 1 capsule (40 mg total) by mouth daily.   lamoTRIgine 100 MG tablet Commonly known as:  LAMICTAL Take 1 tablet (100 mg total) by mouth 2 (two) times daily.   LORazepam 1 MG tablet Commonly known as:  ATIVAN Take 1 tablet (1 mg total) by mouth 3 (three) times daily as needed for anxiety.   zaleplon 10 MG capsule Commonly known as:  SONATA Take 1 capsule (10 mg total) by mouth at bedtime as needed for sleep. When traveling          Objective:   Physical Exam BP 118/72 (BP Location: Left Arm, Patient Position: Sitting, Cuff Size: Small)   Pulse 77   Temp 97.7 F (36.5 C) (Oral)   Resp 14   Ht 5\' 6"  (1.676 m)   Wt 186 lb (84.4 kg)   SpO2 98%   BMI 30.02 kg/m  General:   Well developed, well nourished . NAD.  HEENT:  Normocephalic . Face symmetric, atraumatic Lungs:  CTA B Normal respiratory effort,  no intercostal retractions, no accessory muscle use. Heart: RRR,  no murmur.  No pretibial edema bilaterally  Skin: Not pale. Not jaundice Neurologic:  alert & oriented X3.  Speech normal, gait appropriate for age and unassisted Psych--  Cognition and judgment appear intact.  Cooperative with normal attention span and concentration.  Behavior appropriate. No anxious or depressed appearing.      Assessment & Plan:   Assessment Prediabetes A1c 5.9 (06-2013) Anxiety  Use to see Dr. Candis Schatz, was unable to find a new psychiatrist ; now meds rx by pcp (since 2016) [facility] Back pain, saw Ortho 2016, had an MRI, dx spinal stenosis, s/p surgery 11-2016, doing great as of 05-2017 GU: --ED  --H/o low testost, dx per urology, declined HRT or more testing --H/o Peyronie's Dz  DX@urology  Sees derm regularly , had few lesions removed   Plan: Prediabetes: Check A1c Anxiety: Well-controlled, RF meds prn Back pain: Doing great after surgery few months ago. No longer taking gabapentin or pain medication Polyuria: Probably due to  increased water intake, but will check A1c and UA. RTC 11-2017 CPX

## 2017-06-11 NOTE — Patient Instructions (Signed)
GO TO THE LAB : Get the blood work  And provide a urine sample   GO TO THE FRONT DESK Schedule your next appointment for a  physical exam by 11-2017 (fasting)

## 2017-06-11 NOTE — Progress Notes (Signed)
Pre visit review using our clinic review tool, if applicable. No additional management support is needed unless otherwise documented below in the visit note. 

## 2017-06-11 NOTE — Assessment & Plan Note (Signed)
Prediabetes: Check A1c Anxiety: Well-controlled, RF meds prn Back pain: Doing great after surgery few months ago. No longer taking gabapentin or pain medication Polyuria: Probably due to increased water intake, but will check A1c and UA. RTC 11-2017 CPX

## 2017-06-12 ENCOUNTER — Other Ambulatory Visit: Payer: Self-pay | Admitting: Internal Medicine

## 2017-07-06 DIAGNOSIS — H903 Sensorineural hearing loss, bilateral: Secondary | ICD-10-CM | POA: Diagnosis not present

## 2017-07-21 DIAGNOSIS — H903 Sensorineural hearing loss, bilateral: Secondary | ICD-10-CM | POA: Diagnosis not present

## 2017-10-13 ENCOUNTER — Other Ambulatory Visit: Payer: Self-pay | Admitting: Internal Medicine

## 2017-10-13 NOTE — Telephone Encounter (Signed)
done

## 2017-10-13 NOTE — Telephone Encounter (Signed)
Pt is requesting refill on lorazepam 1mg .  Last OV: 06/11/2017  Last Fill: 05/18/2017 #90 and 2RF (Pt sig: 1 tab tid prn) UDS: 12/31/2016 Low risk  Unable to get into [facility] at this time.  Please advise.

## 2017-10-13 NOTE — Telephone Encounter (Signed)
Thank you :)

## 2017-11-03 ENCOUNTER — Other Ambulatory Visit: Payer: Self-pay | Admitting: Internal Medicine

## 2017-12-10 ENCOUNTER — Ambulatory Visit: Payer: 59 | Admitting: Internal Medicine

## 2017-12-11 ENCOUNTER — Encounter: Payer: Self-pay | Admitting: Internal Medicine

## 2017-12-11 ENCOUNTER — Ambulatory Visit (INDEPENDENT_AMBULATORY_CARE_PROVIDER_SITE_OTHER): Payer: 59 | Admitting: Internal Medicine

## 2017-12-11 VITALS — BP 116/78 | HR 64 | Temp 97.8°F | Resp 14 | Ht 66.0 in | Wt 181.2 lb

## 2017-12-11 DIAGNOSIS — Z0000 Encounter for general adult medical examination without abnormal findings: Secondary | ICD-10-CM | POA: Diagnosis not present

## 2017-12-11 LAB — COMPREHENSIVE METABOLIC PANEL
ALT: 24 U/L (ref 0–53)
AST: 21 U/L (ref 0–37)
Albumin: 4.6 g/dL (ref 3.5–5.2)
Alkaline Phosphatase: 55 U/L (ref 39–117)
BILIRUBIN TOTAL: 0.5 mg/dL (ref 0.2–1.2)
BUN: 19 mg/dL (ref 6–23)
CALCIUM: 9.5 mg/dL (ref 8.4–10.5)
CHLORIDE: 103 meq/L (ref 96–112)
CO2: 30 meq/L (ref 19–32)
CREATININE: 1.17 mg/dL (ref 0.40–1.50)
GFR: 68.03 mL/min (ref >60.00)
GLUCOSE: 102 mg/dL — AB (ref 70–99)
Potassium: 5.2 mEq/L — ABNORMAL HIGH (ref 3.5–5.1)
SODIUM: 141 meq/L (ref 135–145)
Total Protein: 6.8 g/dL (ref 6.0–8.3)

## 2017-12-11 LAB — HEMOGLOBIN A1C: Hgb A1c MFr Bld: 5.7 % (ref 4.6–6.5)

## 2017-12-11 LAB — LIPID PANEL
CHOL/HDL RATIO: 3
Cholesterol: 165 mg/dL (ref 0–200)
HDL: 49 mg/dL (ref >39.00)
LDL Cholesterol: 97 mg/dL (ref 0–99)
NONHDL: 116.42
TRIGLYCERIDES: 97 mg/dL (ref 0.0–149.0)
VLDL: 19.4 mg/dL (ref 0.0–40.0)

## 2017-12-11 LAB — PSA: PSA: 0.76 ng/mL (ref 0.10–4.00)

## 2017-12-11 NOTE — Progress Notes (Signed)
Pre visit review using our clinic review tool, if applicable. No additional management support is needed unless otherwise documented below in the visit note. 

## 2017-12-11 NOTE — Assessment & Plan Note (Signed)
Td ~ 2010 ; declined flu shots; shingrix not available -CCS: Cscope at age 57 per pt, again 09-2015, 5 years  -Prostate ca screening : DRE normal today, check a PSA Labs : CMP, FLP, A1c, PSA -Diet and exercise: Has improved significantly, particularly his diet.  Lost some weight.  Praised

## 2017-12-11 NOTE — Progress Notes (Signed)
Subjective:    Patient ID: Colton Mcconnell, male    DOB: 08-19-1961, 57 y.o.   MRN: [id number]  DOS:  12/11/2017 Type of visit - description : cpx Interval history:  No major concerns , chronic med problems controlled   Wt Readings from Last 3 Encounters:  12/11/17 181 lb 4 oz (82.2 kg)  06/11/17 186 lb (84.4 kg)  12/09/16 201 lb 8 oz (91.4 kg)    Review of Systems   A 14 point review of systems is negative   Past Medical History:  Diagnosis Date  . Anxiety   . Arthritis    neck, lower back  . Back pain    sporadic  . Disc degeneration, lumbosacral    at 4-5, 5-1  . Erectile dysfunction   . Facet arthritis of lumbosacral region ([place])    at 4-5 and neural foraminal stenosis  . Maxillary sinusitis    Recurrent  . 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 BACK SURGERY     cysts removed from spine age 17 and 49  . COLONOSCOPY      Social History   Socioeconomic History  . Marital status: Married    Spouse name: Not on file  . Number of children: 2  . Years of education: Not on file  . Highest education level: Not on file  Social Needs  . Financial resource strain: Not on file  . Food insecurity - worry: Not on file  . Food insecurity - inability: Not on file  . Transportation needs - medical: Not on file  . Transportation needs - non-medical: Not on file  Occupational History  . Occupation: Lobbyist: [employer]    Comment: [employer]   Tobacco Use  . Smoking status: Never Smoker  . Smokeless tobacco: Never Used  Substance and Sexual Activity  . Alcohol use: No    Alcohol/week: 6.0 oz    Types: 10 Cans of beer per week    Comment: none since 08-2016  . Drug use: No  . Sexual activity: Yes  Other Topics Concern  . Not on file  Social History Narrative   2 boys- adults   2nd wife has 2 daughters, independent       Allergies as of 12/11/2017   No  Known Allergies     Medication List        Accurate as of 12/11/17  8:03 AM. Always use your most recent med list.          FLUoxetine 40 MG capsule Commonly known as:  PROZAC Take 1 capsule (40 mg total) by mouth daily.   lamoTRIgine 100 MG tablet Commonly known as:  LAMICTAL Take 1 tablet (100 mg total) by mouth 2 (two) times daily.   LORazepam 1 MG tablet Commonly known as:  ATIVAN TAKE 1 TABLET BY MOUTH 3 TIMES A DAY AS NEEDED FOR ANXIETY   zaleplon 10 MG capsule Commonly known as:  SONATA Take 1 capsule (10 mg total) by mouth at bedtime as needed for sleep. When traveling          Objective:   Physical Exam BP 116/78 (BP Location: Left Arm, Patient Position: Sitting, Cuff Size: Small)   Pulse 64   Temp 97.8 F (36.6 C) (Oral)   Resp 14   Ht 5\' 6"  (1.676 m)   Wt 181  lb 4 oz (82.2 kg)   SpO2 96%   BMI 29.25 kg/m  General:   Well developed, well nourished . NAD.  Neck: No  thyromegaly  HEENT:  Normocephalic . Face symmetric, atraumatic Lungs:  CTA B Normal respiratory effort, no intercostal retractions, no accessory muscle use. Heart: RRR,  no murmur.  No pretibial edema bilaterally  Abdomen:  Not distended, soft, non-tender. No rebound or rigidity.   Rectal:  External abnormalities: none. Normal sphincter tone. No rectal masses or tenderness.  No stools  Prostate: Prostate gland firm and smooth, no enlargement, nodularity, tenderness, mass, asymmetry or induration.  Skin: Exposed areas without rash. Not pale. Not jaundice Neurologic:  alert & oriented X3.  Speech normal, gait appropriate for age and unassisted Strength symmetric and appropriate for age.  Psych: Cognition and judgment appear intact.  Cooperative with normal attention span and concentration.  Behavior appropriate. No anxious or depressed appearing.     Assessment & Plan:    Assessment Prediabetes A1c 5.9 (06-2013) Anxiety  Use to see Dr. Candis Schatz, was unable to find a new  psychiatrist ; now meds rx by pcp (since 2016) MS[facility] Back pain, saw Ortho 2016, had an MRI, dx spinal stenosis, s/p surgery 11-2016, doing great as of 05-2017 GU: --ED  --H/o low testost, dx per urology, declined HRT or more testing --H/o Peyronie's Dz  DX@urology  Sees derm regularly , had few lesions removed   Plan: Prediabetes: Doing great with lifestyle, check a A1c Anxiety: Currently well controlled on fluoxetine, Lamictal and lorazepam.  Contract today.  Hold off on UDS for now due to insurance sometimes not covering it.  UDS 11-2016 low risk. Hardly ever uses sonata, will leave it in the medication list in case he needs it MSK: Back pain is stable. RTC 1 year

## 2017-12-11 NOTE — Patient Instructions (Signed)
GO TO THE LAB : Get the blood work     GO TO THE FRONT DESK Schedule your next appointment   for a physical exam in 1 year 

## 2017-12-12 NOTE — Assessment & Plan Note (Signed)
Prediabetes: Doing great with lifestyle, check a A1c Anxiety: Currently well controlled on fluoxetine, Lamictal and lorazepam.  Contract today.  Hold off on UDS for now due to insurance sometimes not covering it.  UDS 11-2016 low risk. Hardly ever uses sonata, will leave it in the medication list in case he needs it MSK: Back pain is stable. RTC 1 year

## 2018-02-09 ENCOUNTER — Other Ambulatory Visit: Payer: Self-pay | Admitting: Internal Medicine

## 2018-02-25 ENCOUNTER — Telehealth: Payer: Self-pay | Admitting: Internal Medicine

## 2018-02-25 NOTE — Telephone Encounter (Addendum)
Pt is requesting refill on lorazepam.  Last OV: 12/11/2017 Last Fill: 10/13/2017 #90 and 2RF (Pt sig: 1 tab tid prn) UDS: 12/09/2016 Low risk CSC: 12/11/2017   Needs UDS only at next [place] in media- 12/11/2017-no discrepancies noted  Please advise

## 2018-02-25 NOTE — Telephone Encounter (Signed)
Sent!

## 2018-03-17 ENCOUNTER — Other Ambulatory Visit: Payer: Self-pay | Admitting: Internal Medicine

## 2018-04-08 DIAGNOSIS — L814 Other melanin hyperpigmentation: Secondary | ICD-10-CM | POA: Diagnosis not present

## 2018-04-08 DIAGNOSIS — L570 Actinic keratosis: Secondary | ICD-10-CM | POA: Diagnosis not present

## 2018-04-08 DIAGNOSIS — D1801 Hemangioma of skin and subcutaneous tissue: Secondary | ICD-10-CM | POA: Diagnosis not present

## 2018-04-08 DIAGNOSIS — L821 Other seborrheic keratosis: Secondary | ICD-10-CM | POA: Diagnosis not present

## 2018-05-06 ENCOUNTER — Telehealth: Payer: Self-pay | Admitting: Internal Medicine

## 2018-05-06 NOTE — Telephone Encounter (Signed)
sent 

## 2018-05-06 NOTE — Telephone Encounter (Signed)
Pt is requesting refill on lorazepam.   Last OV: 12/11/2017 Last Fill: 02/25/2018 #90 and 2RF UDS: 12/09/2016 Low risk  NCCR in media from 12/11/2017- no discrepancies noted  Please advise.

## 2018-06-01 IMAGING — MR MR LUMBAR SPINE WO/W CM
4 of 7 series · 25 of 48 positions shown · IV contrast (17ml Multihance)
Comparison: Lumbar radiographs 03/29/2015.

CLINICAL DATA: 55-year-old male with progressive chronic lumbar
back pain. Left side pain radiating to the left leg with weakness
for several months. No known injury. Reports lumbar spine surgery as
a child ( "Cyst removed from lumbar spine" ). Spinal steroid
injection 2 weeks ago. Initial encounter.

EXAM:
MRI LUMBAR SPINE WITHOUT AND WITH CONTRAST
TECHNIQUE: Multiplanar and multiecho pulse sequences of the lumbar spine were
obtained without and with intravenous contrast.
CONTRAST:  17mL MULTIHANCE GADOBENATE DIMEGLUMINE 529 MG/ML IV SOLN

[Series 3: T1 · sagittal · 4.0mm · 0.47mm/px · 4 of 12 slices shown (1 of 2)]
[im 1/12]
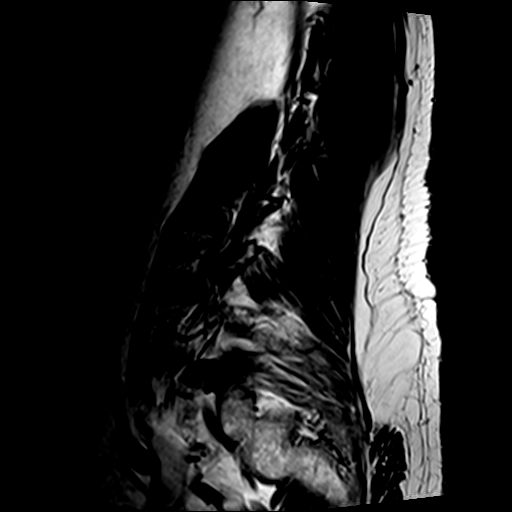
[im 4/12]
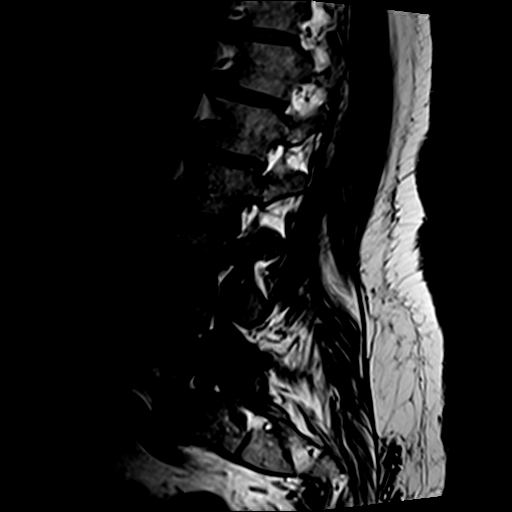
[im 8/12]
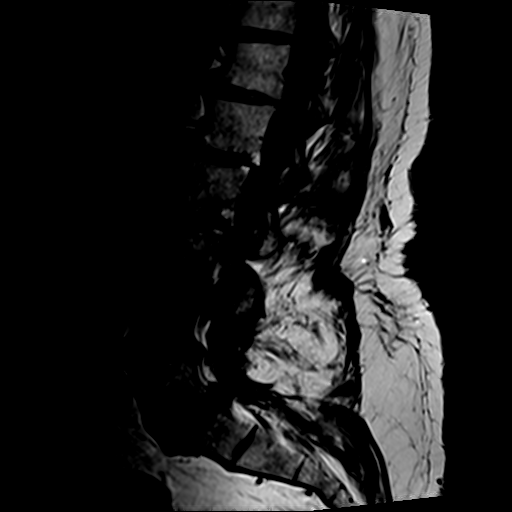
[im 12/12]
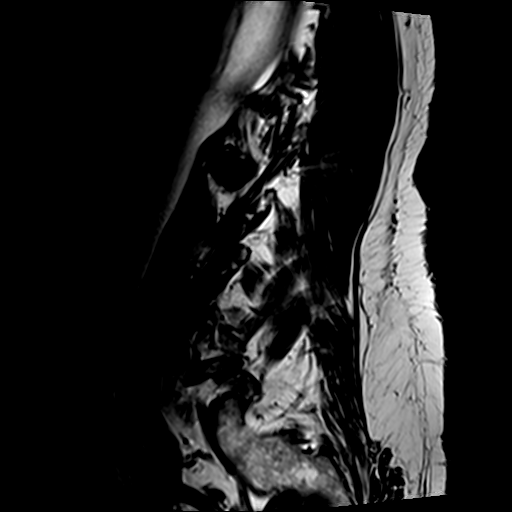

[Series 4: T1 · axial · 4.0mm · 0.37mm/px · z∈[-108,+51]mm · 6 of 32 slices shown (2 of 2)]
[im 1/32]
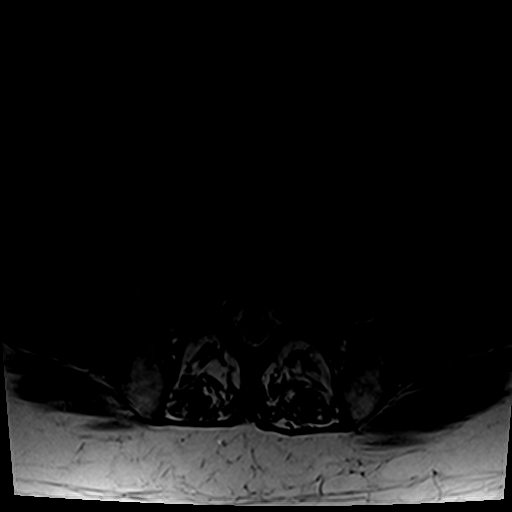
[im 4/32]
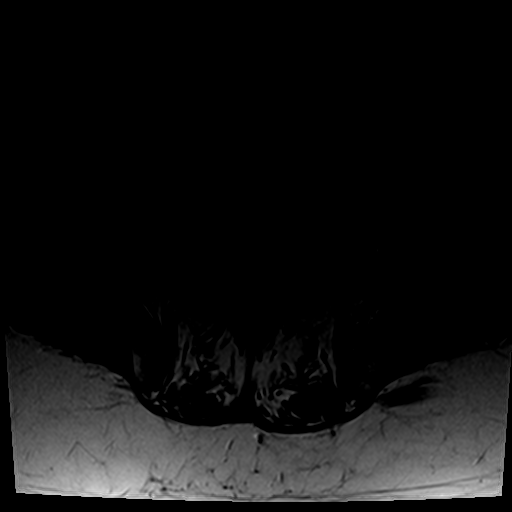
[im 7/32]
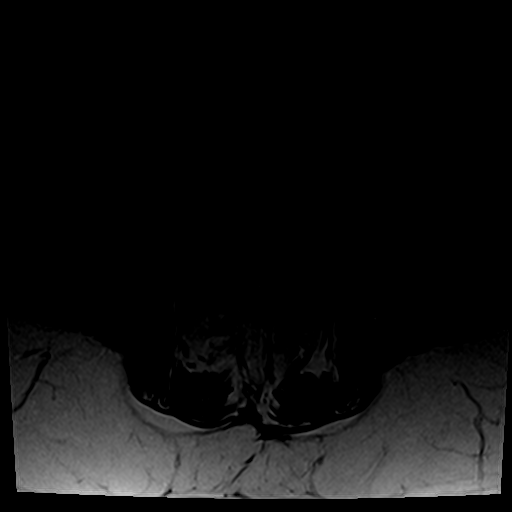
[im 10/32]
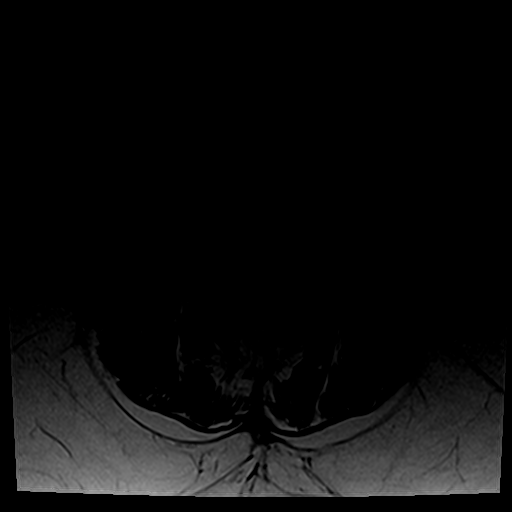
[im 16/32]
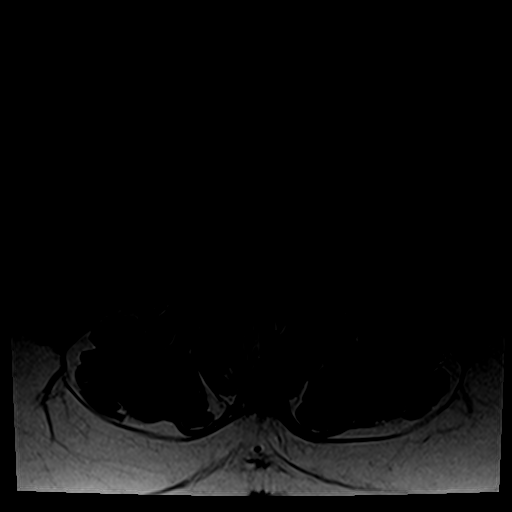
[im 28/32]
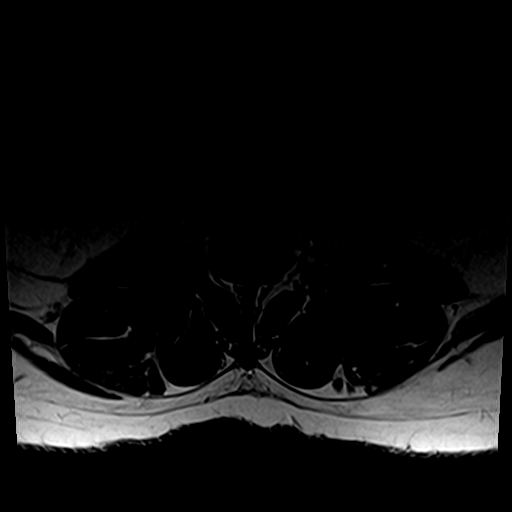

[Series 5: T2 · axial · 4.0mm · 0.74mm/px · z∈[-108,+98]mm · 11 of 32 slices shown (1 of 2)]
[im 1/32]
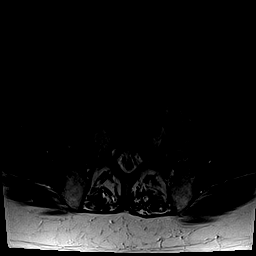
[im 4/32]
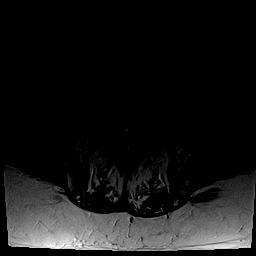
[im 7/32]
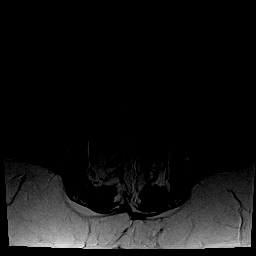
[im 10/32]
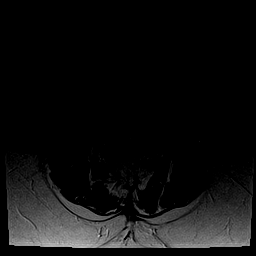
[im 13/32]
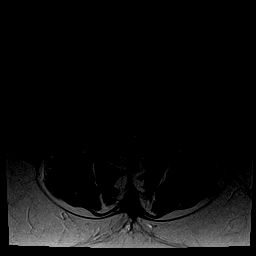
[im 16/32]
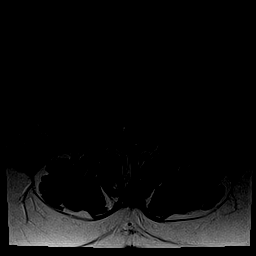
[im 19/32]
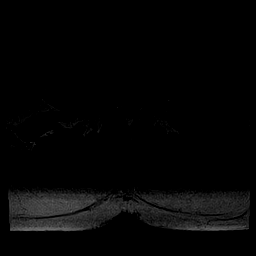
[im 22/32]
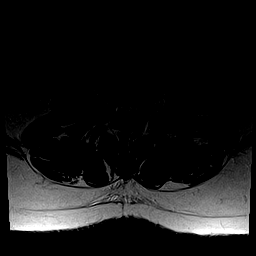
[im 25/32]
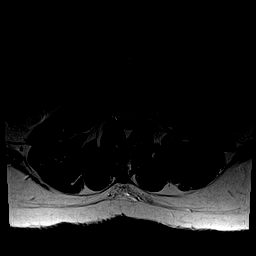
[im 28/32]
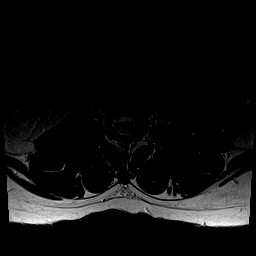
[im 32/32]
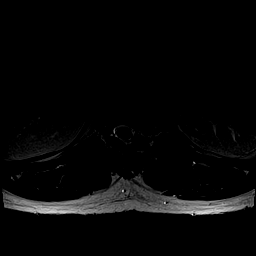

[Series 6: T2 · sagittal · 4.0mm · 0.47mm/px · 4 of 12 slices shown (2 of 2)]
[im 1/12]
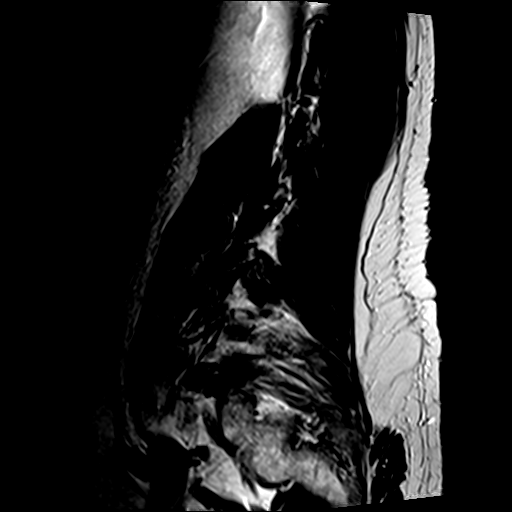
[im 4/12]
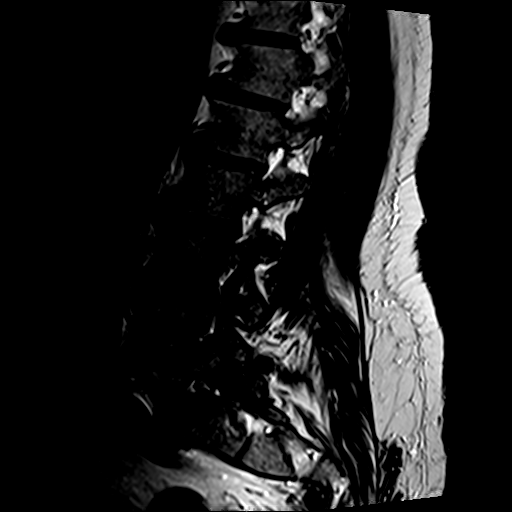
[im 8/12]
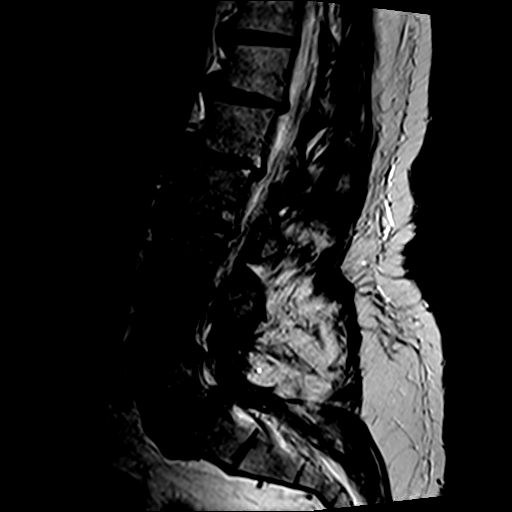
[im 12/12]
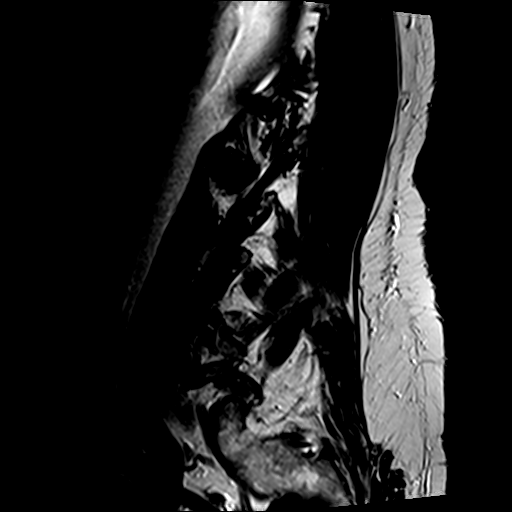

[25 of 48 positions shown; findings below may reference images not displayed]

FINDINGS: Segmentation: Normal lumbar segmentation as demonstrated on the
comparison.

Alignment: Stable vertebral height and alignment from the recent
radiographs. Exaggerated lordosis about the L4-L5 level with mild
retrolisthesis of L4 on L5. Trace anterolisthesis of L5 on S1.
Straightening of the upper lumbar spine with multilevel mild
retrolisthesis.

Vertebrae: Degenerative appearing endplate marrow edema inferiorly
at L4 and posterolaterally on the right at L4-L5. Visible sacrum and
SI joints appear normal and intact.

Conus medullaris: Extends to the L1 level and appears normal. No
abnormal intradural enhancement.

Paraspinal and other soft tissues: Mild postoperative changes to the
posterior paraspinal soft tissues at L4 and L5. Otherwise negative.

Disc levels:

T11-T12: Anterior disc bulge and endplate spurring. Mild facet
hypertrophy. No significant stenosis.

T12-L1: Mild disc space loss with circumferential disc bulge.
Broad-based posterior component. Borderline to mild spinal stenosis
(series 5, image 3).

L1-L2: Disc desiccation and disc space loss with circumferential
disc bulge. Broad-based posterior component. Borderline to mild
spinal stenosis.

L2-L3: Mild retrolisthesis. Small chronic right L2 inferior endplate
Schmorl's node. Disc space loss and circumferential disc bulge with
broad-based posterior component. Mild facet hypertrophy. Mild spinal
stenosis.

L3-L4: Mild retrolisthesis. Disc desiccation and circumferential
disc bulge with broad-based posterior component. Moderate facet
hypertrophy. Trace facet joint fluid. Mild to moderate spinal
stenosis (series 5, image 19).

L4-L5: Previous surgery to the posterior elements. Mild
retrolisthesis. Moderate to severe disc space loss. Probable vacuum
disc. Endplate spurring. Severe facet hypertrophy greater on the
left (series 5, image 25). Some epidural lipomatosis. Severe spinal
stenosis. Severe left lateral recess stenosis. Mild to moderate
right lateral recess stenosis. Mild to moderate bilateral L4
foraminal stenosis.

L5-S1: Previous surgery to the posterior elements. Mild
anterolisthesis. Severe bilateral facet hypertrophy. Mild
broad-based disc bulge with superimposed broad-based right
subarticular disc protrusion (series 5, image 28). Epidural
lipomatosis. Severe spinal stenosis. Severe right lateral recess
stenosis. Mild to moderate left lateral recess stenosis. Mild to
moderate left and moderate to severe right L5 foraminal stenosis.
Moderate to severe left and mild to moderate right L5 foraminal
stenosis.
IMPRESSION: 1. Postoperative changes to the posterior elements of L4 and L5 with
mild spondylolisthesis at both levels and focal kyphosis at L4-L5.
Severe disc degeneration at that level and severe facet arthropathy
at both levels.
Subsequent severe multifactorial spinal stenosis at both levels.
Severe left lateral recess stenosis at L4-L5 and right lateral
recess stenosis at L5-S1. Up to moderate bilateral L4 and moderate
to severe L5 neural foraminal stenosis.
2. Mild to moderate multifactorial spinal stenosis at L3-L4, and
mild lumbar spinal stenosis elsewhere.

## 2018-08-25 ENCOUNTER — Telehealth: Payer: Self-pay

## 2018-08-25 NOTE — Telephone Encounter (Signed)
PCP is available 08/31/2018- however nothing sooner as PCP is off on 11/1. He can schedule w/ another provider if he doesn't want to wait.

## 2018-08-25 NOTE — Telephone Encounter (Signed)
Called pt and scheduled an OV for 11/5. Pt stated that he preferred to see Dr. Larose Kells, since he is more well versed on his condition.

## 2018-08-25 NOTE — Telephone Encounter (Signed)
Copied from [place]. Topic: General - Inquiry >> Aug 25, 2018 12:55 PM Judyann Munson wrote: Reason for CRM: Patient is calling to advise this medication FLUoxetine (PROZAC) 40 MG capsule and  LORazepam (ATIVAN) 1 MG tabletis no longer helping the patient he is still stressed and not sleeping. He is requesting call back in regards to this.Dr.Paz next availability is 09-01-18. The patient was hoping to schedule something sooner

## 2018-08-31 ENCOUNTER — Ambulatory Visit: Payer: 59 | Admitting: Internal Medicine

## 2018-09-02 ENCOUNTER — Ambulatory Visit: Payer: 59 | Admitting: Internal Medicine

## 2018-09-06 ENCOUNTER — Encounter: Payer: Self-pay | Admitting: Internal Medicine

## 2018-09-06 ENCOUNTER — Ambulatory Visit (INDEPENDENT_AMBULATORY_CARE_PROVIDER_SITE_OTHER): Payer: 59 | Admitting: Internal Medicine

## 2018-09-06 VITALS — BP 118/64 | HR 70 | Temp 97.9°F | Resp 16 | Ht 66.0 in | Wt 191.1 lb

## 2018-09-06 DIAGNOSIS — F419 Anxiety disorder, unspecified: Secondary | ICD-10-CM

## 2018-09-06 MED ORDER — FLUOXETINE HCL 20 MG PO TABS: 60.0000 mg | ORAL_TABLET | Freq: Every day | ORAL | 3 refills | Status: DC

## 2018-09-06 NOTE — Progress Notes (Signed)
Subjective:    Patient ID: Colton Mcconnell, male    DOB: 1961/03/01, 57 y.o.   MRN: [id number]  DOS:  09/06/2018 Type of visit - description : acute Interval history: Work environment change significantly the last 6 months, patient reports environment  is very dysfunctional and stressful. His anxiety was well controlled for years up to 6 months ago when he noticed that he is really anxious, cannot calm down, not sleeping well. He is ready to get some help.   Review of Systems Has been drinking slightly more than usual, gets to 3 beers every other day, is not "reacting will" to etoh/beer. Has also used marijuana sometimes. Denies suicidal or homicidal ideas.   Past Medical History:  Diagnosis Date  . Anxiety   . Arthritis    neck, lower back  . Back pain    sporadic  . Disc degeneration, lumbosacral    at 4-5, 5-1  . Erectile dysfunction   . Facet arthritis of lumbosacral region    at 4-5 and neural foraminal stenosis  . Maxillary sinusitis    Recurrent  . 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 BACK SURGERY     cysts removed from spine age 9 and 89  . COLONOSCOPY      Social History   Socioeconomic History  . Marital status: Married    Spouse name: Not on file  . Number of children: 2  . Years of education: Not on file  . Highest education level: Not on file  Occupational History  . Occupation: Lobbyist: [employer]    Comment: [employer]   Social Needs  . Financial resource strain: Not on file  . Food insecurity:    Worry: Not on file    Inability: Not on file  . Transportation needs:    Medical: Not on file    Non-medical: Not on file  Tobacco Use  . Smoking status: Never Smoker  . Smokeless tobacco: Never Used  Substance and Sexual Activity  . Alcohol use: No    Alcohol/week: 10.0 standard drinks    Types: 10 Cans of beer per week    Comment:  none since 08-2016 (rare)  . Drug use: No  . Sexual activity: Yes  Lifestyle  . Physical activity:    Days per week: Not on file    Minutes per session: Not on file  . Stress: Not on file  Relationships  . Social connections:    Talks on phone: Not on file    Gets together: Not on file    Attends religious service: Not on file    Active member of club or organization: Not on file    Attends meetings of clubs or organizations: Not on file    Relationship status: Not on file  . Intimate partner violence:    Fear of current or ex partner: Not on file    Emotionally abused: Not on file    Physically abused: Not on file    Forced sexual activity: Not on file  Other Topics Concern  . Not on file  Social History Narrative   2 boys- adults   2nd wife has 2 daughters, independent       Allergies as of 09/06/2018   No Known Allergies     Medication List        Accurate  as of 09/06/18 11:59 PM. Always use your most recent med list.          FLUoxetine 20 MG tablet Commonly known as:  PROZAC Take 3 tablets (60 mg total) by mouth daily.   lamoTRIgine 100 MG tablet Commonly known as:  LAMICTAL Take 1 tablet (100 mg total) by mouth 2 (two) times daily.   LORazepam 1 MG tablet Commonly known as:  ATIVAN TAKE 1 TABLET (1 MG TOTAL) BY MOUTH 3 (THREE) TIMES DAILY AS NEEDED FOR ANXIETY.   zaleplon 10 MG capsule Commonly known as:  SONATA Take 1 capsule (10 mg total) by mouth at bedtime as needed for sleep. When traveling          Objective:   Physical Exam BP 118/64 (BP Location: Left Arm, Patient Position: Sitting, Cuff Size: Small)   Pulse 70   Temp 97.9 F (36.6 C) (Oral)   Resp 16   Ht 5\' 6"  (1.676 m)   Wt 191 lb 2 oz (86.7 kg)   SpO2 95%   BMI 30.85 kg/m  General:   Well developed, NAD, BMI noted. HEENT:  Normocephalic . Face symmetric, atraumatic Skin: Not pale. Not jaundice Neurologic:  alert & oriented X3.  Speech normal, gait appropriate for age  and unassisted Psych--  Cognition and judgment appear intact.  Cooperative with normal attention span and concentration.  Behavior appropriate. Slightly anxious but not  depressed appearing.      Assessment & Plan:   Assessment Prediabetes A1c 5.9 (06-2013) Anxiety  Use to see Dr. Candis Schatz, was unable to find a new psychiatrist ; now meds rx by pcp (since 2016) [facility] Back pain, saw Ortho 2016, had an MRI, dx spinal stenosis, s/p surgery 11-2016, doing great as of 05-2017 GU: --ED  --H/o low testost, dx per urology, declined HRT or more testing --H/o Peyronie's Dz  DX@urology  Sees derm regularly , had few lesions removed   Plan: Anxiety: Long history of anxiety, previously seen by psychiatry, I assume the medications prescription back in 2016.  Currently on fluoxetine 40 mg, Lamictal, Ativan. Stress increased at work for 6 months, anxiety has resurfaced.  Also PHQ-9 today scored 15 which is moderate depression.  No suicidal ideas. We had a long conversation, I advised to get some counseling; also see if there is anything he can do about his work situation. We also agreed to increase fluoxetine to 60 mg, continue Lamictal and Ativan.  Previously did not like Wellbutrin. I also rec  him to go back to psychiatry, information provided. Discussed to do more exercise, start yoga rather than drink more etoh, he was very receptive to the idea RTC 4 w  Today, I spent more than  25  min with the patient: >50% of the time counseling regards anxiety mngmt, listening therapy

## 2018-09-06 NOTE — Progress Notes (Signed)
Pre visit review using our clinic review tool, if applicable. No additional management support is needed unless otherwise documented below in the visit note. 

## 2018-09-06 NOTE — Patient Instructions (Signed)
Please see about getting counseling  Increase fluoxetine to 60 mg daily  Please get an appointment to see one of the psychiatrists in town.  Come back in 4 weeks ( unless you  have seen a psychiatrist)

## 2018-09-07 NOTE — Assessment & Plan Note (Signed)
Anxiety: Long history of anxiety, previously seen by psychiatry, I assume the medications prescription back in 2016.  Currently on fluoxetine 40 mg, Lamictal, Ativan. Stress increased at work for 6 months, anxiety has resurfaced.  Also PHQ-9 today scored 15 which is moderate depression.  No suicidal ideas. We had a long conversation, I advised to get some counseling; also see if there is anything he can do about his work situation. We also agreed to increase fluoxetine to 60 mg, continue Lamictal and Ativan.  Previously did not like Wellbutrin. I also rec  him to go back to psychiatry, information provided. Discussed to do more exercise, start yoga rather than drink more etoh, he was very receptive to the idea RTC 4 w

## 2018-09-20 ENCOUNTER — Telehealth: Payer: Self-pay | Admitting: Internal Medicine

## 2018-09-20 NOTE — Telephone Encounter (Signed)
Sent!

## 2018-09-20 NOTE — Telephone Encounter (Signed)
Pt is requesting refill on lorazepam.   Last OV: 09/06/2018  Last Fill: 05/06/2018 #90 and 2RF UDS: 12/09/2016 Low risk  NCCR printed- no discrepancies noted- sent for scanning.

## 2018-10-29 ENCOUNTER — Telehealth: Payer: Self-pay | Admitting: Internal Medicine

## 2018-10-29 NOTE — Telephone Encounter (Signed)
Please call the patient, let him know I am refilling his medication however he needs a follow-up here to see how he is doing unless he got established with a psychiatrist.

## 2018-10-29 NOTE — Telephone Encounter (Signed)
Pt is requesting refill on lorazepam.   Last OV: 09/06/2018 Last Fill: 09/20/2018 #90 and 0RF Pt sig: 1 tab tid prn UDS: 12/09/2017 Low risk  NCCR in media from 09/20/2018

## 2018-10-29 NOTE — Telephone Encounter (Signed)
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

## 2018-10-29 NOTE — Telephone Encounter (Signed)
thx

## 2018-11-29 DIAGNOSIS — L820 Inflamed seborrheic keratosis: Secondary | ICD-10-CM | POA: Diagnosis not present

## 2018-12-07 DIAGNOSIS — Z79891 Long term (current) use of opiate analgesic: Secondary | ICD-10-CM | POA: Diagnosis not present

## 2019-01-19 ENCOUNTER — Other Ambulatory Visit: Payer: Self-pay

## 2019-01-19 MED ORDER — FLUOXETINE HCL 20 MG PO TABS: 60.0000 mg | ORAL_TABLET | Freq: Every day | ORAL | 0 refills | Status: AC

## 2019-02-15 ENCOUNTER — Other Ambulatory Visit: Payer: Self-pay | Admitting: Internal Medicine

## 2019-02-28 ENCOUNTER — Telehealth: Payer: Self-pay

## 2019-02-28 NOTE — Telephone Encounter (Signed)
Pt due for visit- please set up virtual visit. Letter for short term disability can be discussed during this visit.

## 2019-02-28 NOTE — Telephone Encounter (Signed)
Copied from [place] [id number]. Topic: General - Inquiry >> Feb 28, 2019  9:24 AM Celene Kras A wrote: Reason for CRM: Pt called requesting to get a letter written by PCP. Pt states he is applying for short term disability and is requesting the letter state that PCP has been treating him for the past two years with medication for anxiety. Please advise.

## 2019-02-28 NOTE — Telephone Encounter (Signed)
Virtual visit scheduled tomorrow.

## 2019-03-01 ENCOUNTER — Other Ambulatory Visit: Payer: Self-pay

## 2019-03-01 ENCOUNTER — Ambulatory Visit (INDEPENDENT_AMBULATORY_CARE_PROVIDER_SITE_OTHER): Payer: 59 | Admitting: Internal Medicine

## 2019-03-01 DIAGNOSIS — F419 Anxiety disorder, unspecified: Secondary | ICD-10-CM | POA: Diagnosis not present

## 2019-03-01 NOTE — Progress Notes (Signed)
Subjective:    Patient ID: Colton Mcconnell, male    DOB: [birth year], 58 y.o.   MRN: [id number]  DOS:  03/01/2019 Type of visit - description: Virtual Visit via Video Note  I connected with@ on 03/01/19 at 10:20 AM EDT by a video enabled telemedicine application and verified that I am speaking with the correct person using two identifiers.   THIS ENCOUNTER IS A VIRTUAL VISIT DUE TO COVID-19 - PATIENT WAS NOT SEEN IN THE OFFICE. PATIENT HAS CONSENTED TO VIRTUAL VISIT / TELEMEDICINE VISIT   Location of patient: home  Location of provider: office  I discussed the limitations of evaluation and management by telemedicine and the availability of in person appointments. The patient expressed understanding and agreed to proceed.  History of Present Illness: Acute visit He is under a lot of stress. Her sister passed away last month, she had a number of medical problems including a feeding tube, eventually had an aspiration pneumonia and passed. His father lives in [place] with his wife, he has Parkinson and dementia, is a challenge to take care of him, he is very involved and check on him daily over the phone and sometimes has to go see him. His anxiety is currently managed by a counselor and a PA. 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   Review of Systems No depression per se, no suicidal ideas [employer] as needed for sleep  Past Medical History:  Diagnosis Date  . Anxiety   . Arthritis    neck, lower back  . Back pain    sporadic  . Disc degeneration, lumbosacral    at 4-5, 5-1  . Erectile dysfunction   . Facet arthritis of lumbosacral region    at 4-5 and neural foraminal stenosis  . Maxillary sinusitis    Recurrent  . Spinal stenosis    laminotomy defect at 4-5, 5-1,  pars defect at 4    Past Surgical History:  Procedure Laterality Date  . BACK SURGERY     cysts removed from spine age 48 and 89  . COLONOSCOPY      Social History   Socioeconomic History  . Marital status: Married    Spouse name: Not on file  . Number of children: 2  . Years of education: Not on file  . Highest education level: Not on file  Occupational History  . Occupation: Lobbyist: [employer]    Comment: [employer]   Social Needs  . Financial resource strain: Not on file  . Food insecurity:    Worry: Not on file    Inability: Not on file  . Transportation needs:    Medical: Not on file    Non-medical: Not on file  Tobacco Use  . Smoking status: Never Smoker  . Smokeless tobacco: Never Used  Substance and Sexual Activity  . Alcohol use: No    Alcohol/week: 10.0 standard drinks    Types: 10 Cans of beer per week    Comment: none since 08-2016 (rare)  . Drug use: No  . Sexual activity: Yes  Lifestyle  . Physical activity:    Days per week: Not on file    Minutes per session: Not on file  . Stress: Not on file  Relationships  . Social connections:    Talks on phone: Not on file    Gets together: Not on file    Attends religious service: Not on file    Active member of club or organization: Not on file    Attends meetings of clubs or organizations: Not on file    Relationship status: Not on file  . Intimate partner violence:    Fear of current or ex partner: Not on file    Emotionally abused: Not on file    Physically abused: Not on file    Forced sexual activity: Not on file  Other Topics Concern  . Not on file  Social History Narrative   2 boys- adults   2nd wife has 2 daughters, independent       Allergies as of 03/01/2019   No Known Allergies     Medication List       Accurate as of Mar 01, 2019 10:20 AM. Always use your most recent med list.        FLUoxetine 20 MG tablet Commonly known as:  PROZAC Take 3 tablets (60  mg total) by mouth daily.   lamoTRIgine 100 MG tablet Commonly known as:  LAMICTAL Take 1 tablet (100 mg total) by mouth 2 (two) times daily.   LORazepam 1 MG tablet Commonly known as:  ATIVAN TAKE 1 TABLET (1 MG TOTAL) BY MOUTH 3 (THREE) TIMES DAILY AS NEEDED FOR ANXIETY.   zaleplon 10 MG capsule Commonly known as:  SONATA Take 1 capsule (10 mg total) by mouth at bedtime as needed for sleep. When traveling           Objective:   Physical Exam There were no vitals taken for this visit.    This is a video virtual visit, he is alert oriented x3, slightly stress, no depressed appearing Assessment     Assessment Prediabetes A1c 5.9 (06-2013) Anxiety  Use to see Dr. Candis Schatz, was unable to find a new psychiatrist ; now meds rx by pcp (since 2016) [facility] Back pain, saw Ortho 2016, had an MRI, dx spinal stenosis, s/p surgery 11-2016, doing great as of 05-2017 GU: --ED  --H/o low testost, dx per urology, declined HRT or more testing --H/o Peyronie's Dz  DX@urology  Sees derm regularly , had few lesions removed   Plan: Anxiety: Currently manage by a psychotherapist and Sharon Seller, a nurse practitioner know who prescribes his medications. Symptoms are not well controlled so far, fortunately no suicidal ideas. He requests from me a letter for short-term disability for 4 weeks.  I think that is reasonable, he has a lot on his plate, he is doing all the right things to get better (medication, psychotherapy, etc.). He will pick up the letter soon (does not has fever, chills, cough.  I asked him to wear a mask) RTC 4 months CPX     I discussed the assessment and treatment plan with the patient. The patient was provided an opportunity to ask questions and all were answered. The patient agreed with the plan and demonstrated an understanding of the instructions.   The patient was advised to call back or seek an in-person evaluation if the symptoms worsen or if the condition fails to  improve as anticipated.

## 2019-03-02 NOTE — Assessment & Plan Note (Signed)
Anxiety: Currently manage by a psychotherapist and Sharon Seller, a nurse practitioner know who prescribes his medications. Symptoms are not well controlled so far, fortunately no suicidal ideas. He requests from me a letter for short-term disability for 4 weeks.  I think that is reasonable, he has a lot on his plate, he is doing all the right things to get better (medication, psychotherapy, etc.). He will pick up the letter soon (does not has fever, chills, cough.  I asked him to wear a mask) RTC 4 months CPX

## 2019-03-11 NOTE — Telephone Encounter (Signed)
Patient has additional [employer] short term disability forms pages 9,10,11 that need to be filled out by PCP. Email forwarded to Dr. Larose Kells CMA Vilma Prader C., please note when received.

## 2019-03-18 ENCOUNTER — Ambulatory Visit (INDEPENDENT_AMBULATORY_CARE_PROVIDER_SITE_OTHER): Payer: 59 | Admitting: Internal Medicine

## 2019-03-18 ENCOUNTER — Telehealth: Payer: Self-pay

## 2019-03-18 ENCOUNTER — Other Ambulatory Visit: Payer: Self-pay

## 2019-03-18 DIAGNOSIS — B029 Zoster without complications: Secondary | ICD-10-CM | POA: Diagnosis not present

## 2019-03-18 MED ORDER — VALACYCLOVIR HCL 1 G PO TABS: 1000.0000 mg | ORAL_TABLET | Freq: Three times a day (TID) | ORAL | 0 refills | Status: DC

## 2019-03-18 NOTE — Telephone Encounter (Signed)
Virtual visit scheduled.  

## 2019-03-18 NOTE — Progress Notes (Signed)
Subjective:    Patient ID: Colton Mcconnell, male    DOB: 1961/08/20, 58 y.o.   MRN: [id number]  DOS:  03/18/2019 Type of visit - description: Virtual Visit via Video Note  I connected with@ on 03/18/19 at  9:00 AM EDT by a video enabled telemedicine application and verified that I am speaking with the correct person using two identifiers.   THIS ENCOUNTER IS A VIRTUAL VISIT DUE TO COVID-19 - PATIENT WAS NOT SEEN IN THE OFFICE. PATIENT HAS CONSENTED TO VIRTUAL VISIT / TELEMEDICINE VISIT   Location of patient: home  Location of provider: office  I discussed the limitations of evaluation and management by telemedicine and the availability of in person appointments. The patient expressed understanding and agreed to proceed.  History of Present Illness: Acute visit, he is with his wife. Symptoms started a week ago with a rash at the left upper back. It itches a little, but also feels like burning whenever he   his t-shirt  touches the area. No other concerns.    Review of Systems No fever chills No chest pain no difficulty breathing No nausea or vomiting. No other rashes  Past Medical History:  Diagnosis Date  . Anxiety   . Arthritis    neck, lower back  . Back pain    sporadic  . Disc degeneration, lumbosacral    at 4-5, 5-1  . Erectile dysfunction   . Facet arthritis of lumbosacral region    at 4-5 and neural foraminal stenosis  . Maxillary sinusitis    Recurrent  . 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 BACK SURGERY     cysts removed from spine age 4 and 80  . COLONOSCOPY      Social History   Socioeconomic History  . Marital status: Married    Spouse name: Not on file  . Number of children: 2  . Years of education: Not on file  . Highest education level: Not on file  Occupational History  . Occupation: Lobbyist: [employer]    Comment: [employer]    Social Needs  . Financial resource strain: Not on file  . Food insecurity:    Worry: Not on file    Inability: Not on file  . Transportation needs:    Medical: Not on file    Non-medical: Not on file  Tobacco Use  . Smoking status: Never Smoker  . Smokeless tobacco: Never Used  Substance and Sexual Activity  . Alcohol use: No    Alcohol/week: 10.0 standard drinks    Types: 10 Cans of beer per week    Comment: none since 08-2016 (rare)  . Drug use: No  . Sexual activity: Yes  Lifestyle  . Physical activity:    Days per week: Not on file    Minutes per session: Not on file  . Stress: Not on file  Relationships  . Social connections:    Talks on phone: Not on file    Gets together: Not on file    Attends religious service: Not on file    Active member of club or organization: Not on file    Attends meetings of clubs or organizations: Not on file    Relationship status: Not on file  . Intimate partner violence:    Fear of current or ex partner: Not on file  Emotionally abused: Not on file    Physically abused: Not on file    Forced sexual activity: Not on file  Other Topics Concern  . Not on file  Social History Narrative   2 boys- adults   2nd wife has 2 daughters, independent       Allergies as of 03/18/2019   No Known Allergies     Medication List       Accurate as of Mar 18, 2019  8:57 AM. If you have any questions, ask your nurse or doctor.        FLUoxetine 20 MG tablet Commonly known as:  PROZAC Take 3 tablets (60 mg total) by mouth daily.   lamoTRIgine 100 MG tablet Commonly known as:  LAMICTAL Take 1 tablet (100 mg total) by mouth 2 (two) times daily.   LORazepam 1 MG tablet Commonly known as:  ATIVAN TAKE 1 TABLET (1 MG TOTAL) BY MOUTH 3 (THREE) TIMES DAILY AS NEEDED FOR ANXIETY.   zaleplon 10 MG capsule Commonly known as:  SONATA Take 1 capsule (10 mg total) by mouth at bedtime as needed for sleep. When traveling            Objective:   Physical Exam Skin:        There were no vitals taken for this visit. This is a virtual video visit, he is alert oriented x3, no apparent distress, the wife pointed the camera to the back and I do see a rash that seems like groups of blisters, the patient agree that they look like blisters.  See graphic     Assessment     Assessment Prediabetes A1c 5.9 (06-2013) Anxiety  Use to see Dr. Candis Schatz, was unable to find a new psychiatrist ; now meds rx by pcp (since 2016) [facility] Back pain, saw Ortho 2016, had an MRI, dx spinal stenosis, s/p surgery 11-2016, doing great as of 05-2017 GU: --ED  --H/o low testost, dx per urology, declined HRT or more testing --H/o Peyronie's Dz  DX@urology  Sees derm regularly , had few lesions removed   Plan: Shingles: Findings likely due to shingles, explained the patient what shingles is, what to expect, the possibility of persistent pain and the fact that the blisters are contagious.. At this point he is using hydrocortisone OTC cream on top and that is okay. We will call in Valtrex He knows to call me if he is not better.   I discussed the assessment and treatment plan with the patient. The patient was provided an opportunity to ask questions and all were answered. The patient agreed with the plan and demonstrated an understanding of the instructions.   The patient was advised to call back or seek an in-person evaluation if the symptoms worsen or if the condition fails to improve as anticipated.

## 2019-03-18 NOTE — Telephone Encounter (Signed)
Copied from [place] [id number]. Topic: General - Other >> Mar 17, 2019  1:14 PM Ivar Drape wrote: Reason for CRM:   Patient needs an appt for a rash on his back.  (Called the practice several times but no one answered)

## 2019-03-21 DIAGNOSIS — B029 Zoster without complications: Secondary | ICD-10-CM | POA: Insufficient documentation

## 2019-03-21 NOTE — Assessment & Plan Note (Signed)
Shingles: Findings likely due to shingles, explained the patient what shingles is, what to expect, the possibility of persistent pain and the fact that the blisters are contagious.. At this point he is using hydrocortisone OTC cream on top and that is okay. We will call in Valtrex He knows to call me if he is not better.

## 2019-07-06 ENCOUNTER — Other Ambulatory Visit: Payer: Self-pay

## 2019-07-06 ENCOUNTER — Ambulatory Visit (INDEPENDENT_AMBULATORY_CARE_PROVIDER_SITE_OTHER): Payer: 59 | Admitting: Internal Medicine

## 2019-07-06 ENCOUNTER — Encounter: Payer: Self-pay | Admitting: Internal Medicine

## 2019-07-06 VITALS — BP 123/71 | HR 66 | Temp 96.6°F | Resp 16 | Ht 66.0 in | Wt 187.4 lb

## 2019-07-06 DIAGNOSIS — F419 Anxiety disorder, unspecified: Secondary | ICD-10-CM | POA: Diagnosis not present

## 2019-07-06 DIAGNOSIS — R739 Hyperglycemia, unspecified: Secondary | ICD-10-CM | POA: Diagnosis not present

## 2019-07-06 DIAGNOSIS — Z0000 Encounter for general adult medical examination without abnormal findings: Secondary | ICD-10-CM | POA: Diagnosis not present

## 2019-07-06 LAB — COMPREHENSIVE METABOLIC PANEL
ALT: 21 U/L (ref 0–53)
AST: 24 U/L (ref 0–37)
Albumin: 4.4 g/dL (ref 3.5–5.2)
Alkaline Phosphatase: 57 U/L (ref 39–117)
BUN: 20 mg/dL (ref 6–23)
CO2: 29 mEq/L (ref 19–32)
Calcium: 9.6 mg/dL (ref 8.4–10.5)
Chloride: 101 mEq/L (ref 96–112)
Creatinine, Ser: 1.1 mg/dL (ref 0.40–1.50)
GFR: 68.36 mL/min (ref >60.00)
Glucose, Bld: 99 mg/dL (ref 70–99)
Potassium: 4.2 mEq/L (ref 3.5–5.1)
Sodium: 137 mEq/L (ref 135–145)
Total Bilirubin: 0.5 mg/dL (ref 0.2–1.2)
Total Protein: 6.7 g/dL (ref 6.0–8.3)

## 2019-07-06 LAB — CBC WITH DIFFERENTIAL/PLATELET
Basophils Absolute: 0 10*3/uL (ref 0.0–0.1)
Basophils Relative: 0.3 % (ref 0.0–3.0)
Eosinophils Absolute: 0.2 10*3/uL (ref 0.0–0.7)
Eosinophils Relative: 3.3 % (ref 0.0–5.0)
HCT: 43.8 % (ref 39.0–52.0)
Hemoglobin: 14.7 g/dL (ref 13.0–17.0)
Lymphocytes Relative: 23.5 % (ref 12.0–46.0)
Lymphs Abs: 1.1 10*3/uL (ref 0.7–4.0)
MCHC: 33.5 g/dL (ref 30.0–36.0)
MCV: 92.9 fl (ref 78.0–100.0)
Monocytes Absolute: 0.4 10*3/uL (ref 0.1–1.0)
Monocytes Relative: 8.4 % (ref 3.0–12.0)
Neutro Abs: 3.1 10*3/uL (ref 1.4–7.7)
Neutrophils Relative %: 64.5 % (ref 43.0–77.0)
Platelets: 178 10*3/uL (ref 150.0–400.0)
RBC: 4.72 Mil/uL (ref 4.22–5.81)
RDW: 13.8 % (ref 11.5–15.5)
WBC: 4.8 10*3/uL (ref 4.0–10.5)

## 2019-07-06 LAB — LIPID PANEL
Cholesterol: 157 mg/dL (ref 0–200)
HDL: 65.5 mg/dL (ref >39.00)
LDL Cholesterol: 70 mg/dL (ref 0–99)
NonHDL: 91.09
Total CHOL/HDL Ratio: 2
Triglycerides: 104 mg/dL (ref 0.0–149.0)
VLDL: 20.8 mg/dL (ref 0.0–40.0)

## 2019-07-06 LAB — HEMOGLOBIN A1C: Hgb A1c MFr Bld: 5.8 % (ref 4.6–6.5)

## 2019-07-06 MED ORDER — HYDROCORTISONE 2.5 % EX CREA: TOPICAL_CREAM | Freq: Two times a day (BID) | CUTANEOUS | 1 refills | Status: AC

## 2019-07-06 NOTE — Progress Notes (Signed)
Pre visit review using our clinic review tool, if applicable. No additional management support is needed unless otherwise documented below in the visit note. 

## 2019-07-06 NOTE — Assessment & Plan Note (Signed)
-   Td 2014 - declined  flu shots - shingrix: declined  -CCS: Cscope at age 58 per pt, again 09-2015, 5 years  -Prostate ca screening : DRE - PSA wnl 2019 Labs : CMP, FLP, CBC, A1c -Diet and exercise: Doing great, eating healthy, exercising daily.

## 2019-07-06 NOTE — Progress Notes (Signed)
Subjective:    Patient ID: Colton Mcconnell, male    DOB: 28-Apr-1961, 58 y.o.   MRN: [id number]  DOS:  07/06/2019 Type of visit - description: Complete physical exam Since the last office visit, he lost his job, he is actively looking for other opportunities. It was very hard but he is doing well emotionally. He is actually exercising more, daily. Diet is healthy. He reports itchy rash at both axillary areas.  Review of Systems   Other than above, a 14 point review of systems is negative      Past Medical History:  Diagnosis Date  . Anxiety   . Arthritis    neck, lower back  . Back pain    sporadic  . Disc degeneration, lumbosacral    at 4-5, 5-1  . Erectile dysfunction   . Facet arthritis of lumbosacral region    at 4-5 and neural foraminal stenosis  . Maxillary sinusitis    Recurrent  . 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 BACK SURGERY     cysts removed from spine age 16 and 89    Social History   Socioeconomic History  . Marital status: Married    Spouse name: Not on file  . Number of children: 2  . Years of education: Not on file  . Highest education level: Not on file  Occupational History  . Occupation: lost job  02/2019 [employer]    Comment:    Social Needs  . Financial resource strain: Not on file  . Food insecurity    Worry: Not on file    Inability: Not on file  . Transportation needs    Medical: Not on file    Non-medical: Not on file  Tobacco Use  . Smoking status: Never Smoker  . Smokeless tobacco: Never Used  Substance and Sexual Activity  . Alcohol use: No    Alcohol/week: 10.0 standard drinks    Types: 10 Cans of beer per week    Comment:  drinks twice a week  . Drug use: No  . Sexual activity: Yes  Lifestyle  . Physical activity    Days per week: Not on file    Minutes per session: Not on file  . Stress: Not on file  Relationships  . Social  Herbalist on phone: Not on file    Gets together: Not on file    Attends religious service: Not on file    Active member of club or organization: Not on file    Attends meetings of clubs or organizations: Not on file    Relationship status: Not on file  . Intimate partner violence    Fear of current or ex partner: Not on file    Emotionally abused: Not on file    Physically abused: Not on file    Forced sexual activity: Not on file  Other Topics Concern  . Not on file  Social History Narrative   Household: Pt and wife   2 boys- adults   2nd wife has 2 daughters, independent    Family History  Problem Relation Age of Onset  . CAD Father        cabg at age 68, currently 57  . Hypertension Father   . Diabetes Father   . Parkinson's disease Father   . Dementia Father   . Breast cancer  Mother   . Alcohol abuse Other        many family members   . Colon cancer Neg Hx   . Prostate cancer Neg Hx   . Colon polyps Neg Hx   . Rectal cancer Neg Hx   . Stomach cancer Neg Hx   . Esophageal cancer Neg Hx        Allergies as of 07/06/2019   No Known Allergies     Medication List       Accurate as of July 06, 2019 11:59 PM. If you have any questions, ask your nurse or doctor.        STOP taking these medications   valACYclovir 1000 MG tablet Commonly known as: Valtrex Stopped by: Kathlene November, MD     TAKE these medications   FLUoxetine 20 MG tablet Commonly known as: PROZAC Take 3 tablets (60 mg total) by mouth daily.   hydrocortisone 2.5 % cream Apply topically 2 (two) times daily. Started by: Kathlene November, MD   lamoTRIgine 100 MG tablet Commonly known as: LAMICTAL Take 1 tablet (100 mg total) by mouth 2 (two) times daily.   LORazepam 1 MG tablet Commonly known as: ATIVAN TAKE 1 TABLET (1 MG TOTAL) BY MOUTH 3 (THREE) TIMES DAILY AS NEEDED FOR ANXIETY.   zaleplon 10 MG capsule Commonly known as: SONATA Take 1 capsule (10 mg total) by mouth at bedtime as  needed for sleep. When traveling           Objective:   Physical Exam BP 123/71 (BP Location: Left Arm, Patient Position: Sitting, Cuff Size: Small)   Pulse 66   Temp (!) 96.6 F (35.9 C) (Temporal)   Resp 16   Ht 5\' 6"  (1.676 m)   Wt 187 lb 6 oz (85 kg)   SpO2 99%   BMI 30.24 kg/m  General: Well developed, NAD, BMI noted Neck: No  thyromegaly  HEENT:  Normocephalic . Face symmetric, atraumatic Lungs:  CTA B Normal respiratory effort, no intercostal retractions, no accessory muscle use. Heart: RRR,  no murmur.  No pretibial edema bilaterally  Abdomen:  Not distended, soft, non-tender. No rebound or rigidity.   Skin: At the axillary area, bilaterally and in a symmetric fashion has 32 cm area of mild redness and scaliness.  No blisters Neurologic:  alert & oriented X3.  Speech normal, gait appropriate for age and unassisted Strength symmetric and appropriate for age.  Psych: Cognition and judgment appear intact.  Cooperative with normal attention span and concentration.  Behavior appropriate. No anxious or depressed appearing.     Assessment      Assessment Prediabetes A1c 5.9 (06-2013) Anxiety  Use to see Dr. Candis Schatz, was unable to find a new psychiatrist ; now meds rx by pcp (since 2016) [facility] Back pain, saw Ortho 2016, had an MRI, dx spinal stenosis, s/p surgery 11-2016  GU: --ED  --H/o low testost, dx per urology, declined HRT or more testing --H/o Peyronie's Dz  DX@urology  Sees derm regularly , had few lesions removed   Plan: Here for CPX Prediabetes: Check A1c Anxiety: Managed elsewhere, despite the fact that he lost his job, he is feeling well with current medications, counseling and physical activity. Rash: See physical exam, likely eczema, prescribed topical steroids Back pain: Still an issue on and off, not severe but steady and mild pain. GU: History of ED, does not need treatment.  Does not see urology for Peyronie's disease. RTC 1 year

## 2019-07-06 NOTE — Patient Instructions (Signed)
GO TO THE LAB : Get the blood work     GO TO THE FRONT DESK Schedule your next appointment   for a physical exam in 1 year 

## 2019-07-07 NOTE — Assessment & Plan Note (Signed)
Here for CPX Prediabetes: Check A1c Anxiety: Managed elsewhere, despite the fact that he lost his job, he is feeling well with current medications, counseling and physical activity. Rash: See physical exam, likely eczema, prescribed topical steroids Back pain: Still an issue on and off, not severe but steady and mild pain. GU: History of ED, does not need treatment.  Does not see urology for Peyronie's disease. RTC 1 year

## 2019-09-05 ENCOUNTER — Telehealth: Payer: Self-pay

## 2019-09-05 MED ORDER — SILDENAFIL CITRATE 20 MG PO TABS: 60.0000 mg | ORAL_TABLET | Freq: Every evening | ORAL | 3 refills | Status: DC | PRN

## 2019-09-05 NOTE — Telephone Encounter (Signed)
Copied from [place] [id number]. Topic: General - Inquiry >> Sep 05, 2019  1:57 PM Mathis Bud wrote: Reason for CRM: Patient is requesting PCP to prescribe patient viagra [phone] PHARMACY  [id number] - [place], [place] [phone] (Phone) [phone] (Fax)

## 2019-09-05 NOTE — Telephone Encounter (Signed)
I reviewed, he has a history of ED, previously treated with Cialis. He also has a h/o Peyronie disease, at some point he saw urology. Advise patient: I did send a prescription for generic Viagra, he can take 3 to 4 tablets once a day as needed. Due to the Peyronie disease, if he has painful erections he needs to stop the medication let me know.  ER if symptoms severe or persistent for more than 3 hours.

## 2019-09-05 NOTE — Telephone Encounter (Signed)
Please advise 

## 2019-09-05 NOTE — Telephone Encounter (Signed)
Mychart message sent to Pt.

## 2019-10-17 ENCOUNTER — Ambulatory Visit: Payer: 59 | Attending: Internal Medicine

## 2019-10-17 DIAGNOSIS — Z20822 Contact with and (suspected) exposure to covid-19: Secondary | ICD-10-CM

## 2019-10-18 LAB — NOVEL CORONAVIRUS, NAA: SARS-CoV-2, NAA: NOT DETECTED

## 2019-11-18 LAB — NOVEL CORONAVIRUS, NAA: SARS-CoV-2, NAA: NOT DETECTED

## 2019-12-06 ENCOUNTER — Encounter: Payer: Self-pay | Admitting: Internal Medicine

## 2020-04-02 ENCOUNTER — Telehealth: Payer: Self-pay | Admitting: Internal Medicine

## 2020-04-02 MED ORDER — LORAZEPAM 1 MG PO TABS: 1.0000 mg | ORAL_TABLET | Freq: Three times a day (TID) | ORAL | 0 refills | Status: DC | PRN

## 2020-04-02 NOTE — Telephone Encounter (Signed)
Advise patient: Prescription sent. Per PDMP, Colton Mcconnell prescribed also the same medication. I am not suspicious of abuse or anything like that but when it comes to controlled substance, it has to be a single practitioner prescribed medications.

## 2020-04-02 NOTE — Telephone Encounter (Signed)
Medication:LORazepam (ATIVAN) 1 MG tablet   Has the patient contacted their pharmacy? No. (If no, request that the patient contact the pharmacy for the refill.) (If yes, when and what did the pharmacy advise?)  Preferred Pharmacy (with phone number or street name): [facility], [place] [facility] Phone:  [phone]  Fax:  [phone]      Agent: Please be advised that RX refills may take up to 3 business days. We ask that you follow-up with your pharmacy.

## 2020-04-02 NOTE — Telephone Encounter (Signed)
Lorazepam refill.   Last OV: 07/06/2019, appt 07/09/20 Last Fill: 10/29/2018 #90 and 0RF Pt sig: 1 tab tid prn UDS: 12/09/2016 Low risk  Needs UDS at next OV.   Also -I have increased to a 90 day supply  (Pt would like sent to [facility]).

## 2020-04-02 NOTE — Telephone Encounter (Signed)
Mychart message sent.

## 2020-06-07 LAB — NOVEL CORONAVIRUS, NAA: SARS-CoV-2, NAA: DETECTED

## 2020-06-11 ENCOUNTER — Encounter: Payer: Self-pay | Admitting: Internal Medicine

## 2020-07-09 ENCOUNTER — Encounter: Payer: 59 | Admitting: Internal Medicine

## 2020-08-31 ENCOUNTER — Encounter: Payer: Self-pay | Admitting: Internal Medicine

## 2020-08-31 ENCOUNTER — Ambulatory Visit (INDEPENDENT_AMBULATORY_CARE_PROVIDER_SITE_OTHER): Payer: 59 | Admitting: Internal Medicine

## 2020-08-31 ENCOUNTER — Other Ambulatory Visit: Payer: Self-pay

## 2020-08-31 VITALS — BP 112/77 | HR 75 | Temp 98.2°F | Resp 16 | Ht 66.0 in | Wt 178.5 lb

## 2020-08-31 DIAGNOSIS — F32A Depression, unspecified: Secondary | ICD-10-CM

## 2020-08-31 DIAGNOSIS — F419 Anxiety disorder, unspecified: Secondary | ICD-10-CM

## 2020-08-31 DIAGNOSIS — G4700 Insomnia, unspecified: Secondary | ICD-10-CM | POA: Diagnosis not present

## 2020-08-31 DIAGNOSIS — K219 Gastro-esophageal reflux disease without esophagitis: Secondary | ICD-10-CM | POA: Diagnosis not present

## 2020-08-31 MED ORDER — LORAZEPAM 1 MG PO TABS: 1.0000 mg | ORAL_TABLET | Freq: Three times a day (TID) | ORAL | 0 refills | Status: DC | PRN

## 2020-08-31 NOTE — Progress Notes (Signed)
Pre visit review using our clinic review tool, if applicable. No additional management support is needed unless otherwise documented below in the visit note. 

## 2020-08-31 NOTE — Patient Instructions (Addendum)
    GO TO THE LAB : Get the blood work     GO TO THE FRONT DESK, PLEASE SCHEDULE YOUR APPOINTMENTS Come back for  A physical in 2 to 3 months

## 2020-08-31 NOTE — Progress Notes (Signed)
Subjective:    Patient ID: Colton Mcconnell, male    DOB: 08-30-1961, 59 y.o.   MRN: [id number]  DOS:  08/31/2020 Type of visit - description: Follow-up Main concern is stress. He lost his job, after that he got a job that is further from his house and he is doing a lot of driving. + stress, the job is very different to his previous one. Unable to focus as much as he did before.  Years ago was DX with ADD and tried Adderall, did not like such medication.  He denies suicidal or homicidal ideas but admits to some depression. Still having issues with sleeping. Has increased alcohol intake (not daily, just more than usual to 3 times a week).   Wt Readings from Last 3 Encounters:  08/31/20 178 lb 8 oz (81 kg)  07/06/19 187 lb 6 oz (85 kg)  09/06/18 191 lb 2 oz (86.7 kg)     Review of Systems Occasionally dizzy for few seconds when he stands up Occasional heartburn, typically when he eats late and goes to bed.  On PPIs as needed  Past Medical History:  Diagnosis Date  . Anxiety   . Arthritis    neck, lower back  . Back pain    sporadic  . Disc degeneration, lumbosacral    at 4-5, 5-1  . Erectile dysfunction   . Facet arthritis of lumbosacral region    at 4-5 and neural foraminal stenosis  . Maxillary sinusitis    Recurrent  . 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 BACK SURGERY     cysts removed from spine age 2 and 40    Allergies as of 08/31/2020   No Known Allergies     Medication List       Accurate as of August 31, 2020 11:59 PM. If you have any questions, ask your nurse or doctor.        STOP taking these medications   zaleplon 10 MG capsule Commonly known as: SONATA Stopped by: Kathlene November, MD     TAKE these medications   FLUoxetine 20 MG tablet Commonly known as: PROZAC Take 3 tablets (60 mg total) by mouth daily.   lamoTRIgine 100 MG tablet Commonly known as:  LAMICTAL Take 1 tablet (100 mg total) by mouth 2 (two) times daily.   LORazepam 1 MG tablet Commonly known as: ATIVAN Take 1 tablet (1 mg total) by mouth 3 (three) times daily as needed for anxiety.   sildenafil 20 MG tablet Commonly known as: REVATIO Take 3-4 tablets (60-80 mg total) by mouth at bedtime as needed.   traZODone 50 MG tablet Commonly known as: DESYREL Take 50 mg by mouth daily as needed.          Objective:   Physical Exam BP 112/77 (BP Location: Right Arm, Patient Position: Sitting, Cuff Size: Normal)   Pulse 75   Temp 98.2 F (36.8 C) (Oral)   Resp 16   Ht 5\' 6"  (1.676 m)   Wt 178 lb 8 oz (81 kg)   SpO2 95%   BMI 28.81 kg/m  General:   Well developed, NAD, BMI noted. HEENT:  Normocephalic . Face symmetric, atraumatic  Lower extremities: no pretibial edema bilaterally  Skin: Not pale. Not jaundice Neurologic:  alert & oriented X3.  Speech normal, gait appropriate for age and unassisted Psych--  Cognition and judgment  appear intact.  Cooperative with normal attention span and concentration.  Behavior appropriate. No anxious or depressed appearing.      Assessment      Assessment Prediabetes A1c 5.9 (06-2013) Anxiety  Use to see Dr. Candis Schatz, was unable to find a new psychiatrist ; now meds rx by pcp (since 2016) MSK Back pain, saw Ortho 2016, had an MRI, dx spinal stenosis, s/p surgery 11-2016  GU: --ED  --H/o low testost, dx per urology, declined HRT or more testing --H/o Peyronie's Dz  DX@urology  Sees derm regularly , had few lesions removed  DX with COVID/2021  PLAN Anxiety, depression, insomnia: Since last visit, loss his job an had to get a new job, it is very different to his previous job and needs to drive longer to get there thus stress has increased. In addition to anxiety, he feels also somewhat depressed and reports lack of concentration. Lack of concentration is probably due to anxiety however at some point in his life he  was diagnosed with ADD (took Adderall for few days, intolerant). Currently on fluoxetine, Lamictal, takes Ativan typically 1 tablet daily sometimes; takes trazodone very rarely. Listening therapy provided. I recommended the following: Formal counseling, add Wellbutrin?  In the past he tried Wellbutrin and did not like how he felt. At the end, we also agreed he'll see a psychiatrist, list of provided given to the patient.  He will reach out to me if he is unable to secure a visit with them. Preventive care: Declined a flu shot, had 2 Covid shot, reluctant to do a booster Dizziness: As described above, observation GERD: As described above, on PPIs as needed, with talk about no late eating, avoid excessive EtOH, etc. RTC 3 months CPX  Time spent with the patient 32 minutes  This visit occurred during the SARS-CoV-2 public health emergency.  Safety protocols were in place, including screening questions prior to the visit, additional usage of staff PPE, and extensive cleaning of exam room while observing appropriate contact time as indicated for disinfecting solutions.

## 2020-09-01 DIAGNOSIS — G4700 Insomnia, unspecified: Secondary | ICD-10-CM | POA: Insufficient documentation

## 2020-09-01 NOTE — Assessment & Plan Note (Signed)
Anxiety, depression, insomnia: Since last visit, loss his job an had to get a new job, it is very different to his previous job and needs to drive longer to get there thus stress has increased. In addition to anxiety, he feels also somewhat depressed and reports lack of concentration. Lack of concentration is probably due to anxiety however at some point in his life he was diagnosed with ADD (took Adderall for few days, intolerant). Currently on fluoxetine, Lamictal, takes Ativan typically 1 tablet daily sometimes; takes trazodone very rarely. Listening therapy provided. I recommended the following: Formal counseling, add Wellbutrin?  In the past he tried Wellbutrin and did not like how he felt. At the end, we also agreed he'll see a psychiatrist, list of provided given to the patient.  He will reach out to me if he is unable to secure a visit with them. Preventive care: Declined a flu shot, had 2 Covid shot, reluctant to do a booster Dizziness: As described above, observation GERD: As described above, on PPIs as needed, with talk about no late eating, avoid excessive EtOH, etc. RTC 3 months CPX

## 2020-10-27 HISTORY — PX: BACK SURGERY: SHX140

## 2020-12-28 ENCOUNTER — Encounter: Payer: Self-pay | Admitting: Internal Medicine

## 2020-12-28 ENCOUNTER — Encounter: Payer: Self-pay | Admitting: Gastroenterology

## 2020-12-28 ENCOUNTER — Ambulatory Visit (INDEPENDENT_AMBULATORY_CARE_PROVIDER_SITE_OTHER): Payer: 59 | Admitting: Internal Medicine

## 2020-12-28 ENCOUNTER — Other Ambulatory Visit: Payer: Self-pay

## 2020-12-28 VITALS — BP 156/92 | HR 76 | Temp 98.2°F | Resp 16 | Ht 66.0 in | Wt 182.0 lb

## 2020-12-28 DIAGNOSIS — R739 Hyperglycemia, unspecified: Secondary | ICD-10-CM | POA: Diagnosis not present

## 2020-12-28 DIAGNOSIS — G4700 Insomnia, unspecified: Secondary | ICD-10-CM

## 2020-12-28 DIAGNOSIS — Z79899 Other long term (current) drug therapy: Secondary | ICD-10-CM

## 2020-12-28 DIAGNOSIS — Z1211 Encounter for screening for malignant neoplasm of colon: Secondary | ICD-10-CM

## 2020-12-28 DIAGNOSIS — Z0000 Encounter for general adult medical examination without abnormal findings: Secondary | ICD-10-CM | POA: Diagnosis not present

## 2020-12-28 DIAGNOSIS — R030 Elevated blood-pressure reading, without diagnosis of hypertension: Secondary | ICD-10-CM | POA: Diagnosis not present

## 2020-12-28 DIAGNOSIS — F32A Depression, unspecified: Secondary | ICD-10-CM

## 2020-12-28 DIAGNOSIS — Z125 Encounter for screening for malignant neoplasm of prostate: Secondary | ICD-10-CM

## 2020-12-28 DIAGNOSIS — F419 Anxiety disorder, unspecified: Secondary | ICD-10-CM

## 2020-12-28 LAB — LIPID PANEL
Cholesterol: 160 mg/dL (ref 0–200)
HDL: 71.2 mg/dL (ref >39.00)
LDL Cholesterol: 77 mg/dL (ref 0–99)
NonHDL: 88.83
Total CHOL/HDL Ratio: 2
Triglycerides: 59 mg/dL (ref 0.0–149.0)
VLDL: 11.8 mg/dL (ref 0.0–40.0)

## 2020-12-28 LAB — CBC WITH DIFFERENTIAL/PLATELET
Basophils Absolute: 0 10*3/uL (ref 0.0–0.1)
Basophils Relative: 0.6 % (ref 0.0–3.0)
Eosinophils Absolute: 0.2 10*3/uL (ref 0.0–0.7)
Eosinophils Relative: 3.4 % (ref 0.0–5.0)
HCT: 44.5 % (ref 39.0–52.0)
Hemoglobin: 15 g/dL (ref 13.0–17.0)
Lymphocytes Relative: 22.9 % (ref 12.0–46.0)
Lymphs Abs: 1 10*3/uL (ref 0.7–4.0)
MCHC: 33.7 g/dL (ref 30.0–36.0)
MCV: 93.1 fl (ref 78.0–100.0)
Monocytes Absolute: 0.4 10*3/uL (ref 0.1–1.0)
Monocytes Relative: 8.3 % (ref 3.0–12.0)
Neutro Abs: 2.8 10*3/uL (ref 1.4–7.7)
Neutrophils Relative %: 64.8 % (ref 43.0–77.0)
Platelets: 198 10*3/uL (ref 150.0–400.0)
RBC: 4.78 Mil/uL (ref 4.22–5.81)
RDW: 13.5 % (ref 11.5–15.5)
WBC: 4.4 10*3/uL (ref 4.0–10.5)

## 2020-12-28 LAB — COMPREHENSIVE METABOLIC PANEL
ALT: 21 U/L (ref 0–53)
AST: 21 U/L (ref 0–37)
Albumin: 4.4 g/dL (ref 3.5–5.2)
Alkaline Phosphatase: 47 U/L (ref 39–117)
BUN: 19 mg/dL (ref 6–23)
CO2: 29 mEq/L (ref 19–32)
Calcium: 9.4 mg/dL (ref 8.4–10.5)
Chloride: 101 mEq/L (ref 96–112)
Creatinine, Ser: 1.12 mg/dL (ref 0.40–1.50)
GFR: 71.09 mL/min (ref >60.00)
Glucose, Bld: 107 mg/dL — ABNORMAL HIGH (ref 70–99)
Potassium: 4.6 mEq/L (ref 3.5–5.1)
Sodium: 138 mEq/L (ref 135–145)
Total Bilirubin: 0.7 mg/dL (ref 0.2–1.2)
Total Protein: 6.7 g/dL (ref 6.0–8.3)

## 2020-12-28 LAB — HEMOGLOBIN A1C: Hgb A1c MFr Bld: 5.5 % (ref 4.6–6.5)

## 2020-12-28 LAB — PSA: PSA: 0.97 ng/mL (ref 0.10–4.00)

## 2020-12-28 LAB — TSH: TSH: 1.16 u[IU]/mL (ref 0.35–4.50)

## 2020-12-28 NOTE — Patient Instructions (Addendum)
Check the  blood pressure 2 or 3 times a month   BP GOAL is between 110/65 and  135/85. If it is consistently higher or lower, let me know Continue low salt diet  Colonoscopy: Call gastroenterology 336 [phone]  [facility] : Get the blood work    [place], [place] back for a check up in 3 months      Advance Directive  Advance directives are legal documents that allow you to make decisions about your health care and medical treatment in case you become unable to communicate for yourself. Advance directives let your wishes be known to family, friends, and health care providers. Discussing and writing advance directives should happen over time rather than all at once. Advance directives can be changed and updated at any time. There are different types of advance directives, such as:  Medical power of attorney.  Living will.  Do not resuscitate (DNR) order or do not attempt resuscitation (DNAR) order. Health care proxy and medical power of attorney A health care proxy is also called a health care agent. This person is appointed to make medical decisions for you when you are unable to make decisions for yourself. Generally, people ask a trusted friend or family member to act as their proxy and represent their preferences. Make sure you have an agreement with your trusted person to act as your proxy. A proxy may have to make a medical decision on your behalf if your wishes are not known. A medical power of attorney, also called a durable power of attorney for health care, is a legal document that names your health care proxy. Depending on the laws in your state, the document may need to be:  Signed.  Notarized.  Dated.  Copied.  Witnessed.  Incorporated into your medical record. You may also want to appoint a trusted person to manage your money in the event you are unable to do so. This is called a durable power of attorney for finances.  It is a separate legal document from the durable power of attorney for health care. You may choose your health care proxy or someone different to act as your agent in money matters. If you do not appoint a proxy, or there is a concern that the proxy is not acting in your best interest, a court may appoint a guardian to act on your behalf. Living will A living will is a set of instructions that state your wishes about medical care when you cannot express them yourself. Health care providers should keep a copy of your living will in your medical record. You may want to give a copy to family members or friends. To alert caregivers in case of an emergency, you can place a card in your wallet to let them know that you have a living will and where they can find it. A living will is used if you become:  Terminally ill.  Disabled.  Unable to communicate or make decisions. The following decisions should be included in your living will:  To use or not to use life support equipment, such as dialysis machines and breathing machines (ventilators).  Whether you want a DNR or DNAR order. This tells health care providers not to use cardiopulmonary resuscitation (CPR) if breathing or heartbeat stops.  To use or not to use tube feeding.  To be given or not to be given food and fluids.  Whether you want comfort (palliative) care  when the goal becomes comfort rather than a cure.  Whether you want to donate your organs and tissues. A living will does not give instructions for distributing your money and property if you should pass away. DNR or DNAR A DNR or DNAR order is a request not to have CPR in the event that your heart stops beating or you stop breathing. If a DNR or DNAR order has not been made and shared, a health care provider will try to help any patient whose heart has stopped or who has stopped breathing. If you plan to have surgery, talk with your health care provider about how your DNR or DNAR  order will be followed if problems occur. What if I do not have an advance directive? Some states assign family decision makers to act on your behalf if you do not have an advance directive. Each state has its own laws about advance directives. You may want to check with your health care provider, attorney, or state representative about the laws in your state. Summary  Advance directives are legal documents that allow you to make decisions about your health care and medical treatment in case you become unable to communicate for yourself.  The process of discussing and writing advance directives should happen over time. You can change and update advance directives at any time.  Advance directives may include a medical power of attorney, a living will, and a DNR or DNAR order. This information is not intended to replace advice given to you by your health care provider. Make sure you discuss any questions you have with your health care provider. Document Revised: 07/17/2020 Document Reviewed: 07/17/2020 Elsevier Patient Education  2021 [employer].

## 2020-12-28 NOTE — Progress Notes (Signed)
Pre visit review using our clinic review tool, if applicable. No additional management support is needed unless otherwise documented below in the visit note. 

## 2020-12-28 NOTE — Progress Notes (Signed)
Subjective:    Patient ID: Colton Mcconnell, male    DOB: 1961/04/29, 60 y.o.   MRN: [id number]  DOS:  12/28/2020 Type of visit - description: CPX  Here for CPX  Currently not working, still very stressed but take his medications regularly and denies suicidal or homicidal ideas. Sleeps well most nights. BP noted to be elevated today, this is the first time he had a elevated BP reading.  BP Readings from Last 3 Encounters:  12/28/20 (!) 156/92  08/31/20 112/77  07/06/19 123/71   Wt Readings from Last 3 Encounters:  12/28/20 182 lb (82.6 kg)  08/31/20 178 lb 8 oz (81 kg)  07/06/19 187 lb 6 oz (85 kg)     Review of Systems  Other than above, a 14 point review of systems is negative     Past Medical History:  Diagnosis Date  . Anxiety   . Arthritis    neck, lower back  . Back pain    sporadic  . Disc degeneration, lumbosacral    at 4-5, 5-1  . Erectile dysfunction   . Facet arthritis of lumbosacral region    at 4-5 and neural foraminal stenosis  . Maxillary sinusitis    Recurrent  . 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 BACK SURGERY     cysts removed from spine age 80 and 65    Allergies as of 12/28/2020   No Known Allergies     Medication List       Accurate as of December 28, 2020 11:59 PM. If you have any questions, ask your nurse or doctor.        FLUoxetine 20 MG tablet Commonly known as: PROZAC Take 3 tablets (60 mg total) by mouth daily.   lamoTRIgine 100 MG tablet Commonly known as: LAMICTAL Take 1 tablet (100 mg total) by mouth 2 (two) times daily.   LORazepam 1 MG tablet Commonly known as: ATIVAN Take 1 tablet (1 mg total) by mouth 3 (three) times daily as needed for anxiety.   sildenafil 20 MG tablet Commonly known as: REVATIO Take 3-4 tablets (60-80 mg total) by mouth at bedtime as needed.   traZODone 50 MG tablet Commonly known as: DESYREL Take 50  mg by mouth daily as needed.          Objective:   Physical Exam BP (!) 156/92 (BP Location: Left Arm, Patient Position: Sitting, Cuff Size: Small)   Pulse 76   Temp 98.2 F (36.8 C) (Oral)   Resp 16   Ht 5\' 6"  (1.676 m)   Wt 182 lb (82.6 kg)   SpO2 96%   BMI 29.38 kg/m  General: Well developed, NAD, BMI noted Neck: No  thyromegaly  HEENT:  Normocephalic . Face symmetric, atraumatic Lungs:  CTA B Normal respiratory effort, no intercostal retractions, no accessory muscle use. Heart: RRR,  no murmur.  Abdomen:  Not distended, soft, non-tender. No rebound or rigidity.   DRE: Normal sphincter tone, no stools, prostate is normal Lower extremities: no pretibial edema bilaterally  Skin: Exposed areas without rash. Not pale. Not jaundice Neurologic:  alert & oriented X3.  Speech normal, gait appropriate for age and unassisted Strength symmetric and appropriate for age.  Psych: Cognition and judgment appear intact.  Cooperative with normal attention span and concentration.  Behavior appropriate. No anxious or depressed appearing.     Assessment  Assessment Prediabetes A1c 5.9 (06-2013) Anxiety  Use to see Dr. Candis Schatz, was unable to find a new psychiatrist ; now meds rx by pcp (since 2016) [facility] Back pain, saw Ortho 2016, had an MRI, dx spinal stenosis, s/p surgery 11-2016  GU: --ED  --H/o low testost, dx per urology, declined HRT or more testing --H/o Peyronie's Dz  DX@urology  Sees derm regularly , had few lesions removed  +FH CAD Cabg age 22 DX with COVID  05/2020  PLAN Here for CPX Elevated BP: First time it has ever been elevated, patient will start checking RTC 3 months Prediabetes: Has a healthy lifestyle, active, doing [employer] (a wt loss program) , has lost some weight on his home scale.  Checking A1c Anxiety, depression, insomnia: See last visit, since then lost another job, currently not working, very stressed but managing okay and remains optimistic.   Wife is very supportive.  On average take 1 Ativan a day, trazodone few times a week.  Still interested on see psychiatry, encouraged to make an appointment.  Check UDS. MSK: Still has episodic back pain RTC 3 months   This visit occurred during the SARS-CoV-2 public health emergency.  Safety protocols were in place, including screening questions prior to the visit, additional usage of staff PPE, and extensive cleaning of exam room while observing appropriate contact time as indicated for disinfecting solutions.

## 2020-12-29 ENCOUNTER — Encounter: Payer: Self-pay | Admitting: Internal Medicine

## 2020-12-29 NOTE — Assessment & Plan Note (Signed)
Here for CPX Elevated BP: First time it has ever been elevated, patient will start checking RTC 3 months Prediabetes: Has a healthy lifestyle, active, doing [employer] (a wt loss program) , has lost some weight on his home scale.  Checking A1c Anxiety, depression, insomnia: See last visit, since then lost another job, currently not working, very stressed but managing okay and remains optimistic.  Wife is very supportive.  On average take 1 Ativan a day, trazodone few times a week.  Still interested on see psychiatry, encouraged to make an appointment.  Check UDS. MSK: Still has episodic back pain RTC 3 months

## 2020-12-29 NOTE — Assessment & Plan Note (Signed)
-   Td 2014 -- shingrix: declined  -Covid vax  x3. - declined  flu shots -CCS: Cscope at age 60 per pt, again 09-2015, 5 years , refer to GI -Prostate ca screening : DRE normal, check a PSA. Labs : CMP, FLP, CBC, A1c, TSH, PSA -Diet and exercise: Doing great, eating healthy, exercising daily. -Discussed advance directives

## 2020-12-30 LAB — DRUG MONITORING, PANEL 8 WITH CONFIRMATION, URINE
6 Acetylmorphine: NEGATIVE ng/mL (ref <10)
Alcohol Metabolites: POSITIVE ng/mL — AB
Alphahydroxyalprazolam: NEGATIVE ng/mL (ref <25)
Alphahydroxymidazolam: NEGATIVE ng/mL (ref <50)
Alphahydroxytriazolam: NEGATIVE ng/mL (ref <50)
Aminoclonazepam: NEGATIVE ng/mL (ref <25)
Amphetamines: NEGATIVE ng/mL (ref <500)
Benzodiazepines: POSITIVE ng/mL — AB (ref <100)
Buprenorphine, Urine: NEGATIVE ng/mL (ref <5)
Cocaine Metabolite: NEGATIVE ng/mL (ref <150)
Creatinine: 160.6 mg/dL
Ethyl Glucuronide (ETG): 1401 ng/mL — ABNORMAL HIGH (ref <500)
Ethyl Sulfate (ETS): 1395 ng/mL — ABNORMAL HIGH (ref <100)
Hydroxyethylflurazepam: NEGATIVE ng/mL (ref <50)
Lorazepam: 871 ng/mL — ABNORMAL HIGH (ref <50)
MDMA: NEGATIVE ng/mL (ref <500)
Marijuana Metabolite: 245 ng/mL — ABNORMAL HIGH (ref <5)
Marijuana Metabolite: POSITIVE ng/mL — AB (ref <20)
Nordiazepam: NEGATIVE ng/mL (ref <50)
Opiates: NEGATIVE ng/mL (ref <100)
Oxazepam: NEGATIVE ng/mL (ref <50)
Oxidant: NEGATIVE ug/mL
Oxycodone: NEGATIVE ng/mL (ref <100)
Temazepam: NEGATIVE ng/mL (ref <50)
pH: 5.5 (ref 4.5–9.0)

## 2020-12-30 LAB — DM TEMPLATE

## 2021-02-27 ENCOUNTER — Telehealth: Payer: Self-pay | Admitting: Internal Medicine

## 2021-02-27 DIAGNOSIS — F419 Anxiety disorder, unspecified: Secondary | ICD-10-CM

## 2021-02-27 DIAGNOSIS — F32A Depression, unspecified: Secondary | ICD-10-CM

## 2021-02-27 NOTE — Telephone Encounter (Signed)
Referral placed.

## 2021-02-27 NOTE — Telephone Encounter (Signed)
Pt, wife called she would like a referral to the [facility] please  fax to [phone]  Please Advice

## 2021-03-08 ENCOUNTER — Encounter: Payer: 59 | Admitting: Gastroenterology

## 2021-03-12 ENCOUNTER — Telehealth: Payer: Self-pay | Admitting: Internal Medicine

## 2021-03-12 MED ORDER — SILDENAFIL CITRATE 20 MG PO TABS: 60.0000 mg | ORAL_TABLET | Freq: Every evening | ORAL | 3 refills | Status: DC | PRN

## 2021-03-12 NOTE — Telephone Encounter (Signed)
Rx sent 

## 2021-03-12 NOTE — Telephone Encounter (Signed)
Medication: sildenafil (REVATIO) 20 MG tablet   Has the patient contacted their pharmacy? Yes.   (If no, request that the patient contact the pharmacy for the refill.) (If yes, when and what did the pharmacy advise?) No more refills   Preferred Pharmacy (with phone number or street name): [id number] - [place], [place] - [address] AT [place] Phone:  [phone]  Fax:  [phone]      Agent: Please be advised that RX refills may take up to 3 business days. We ask that you follow-up with your pharmacy.

## 2021-04-02 ENCOUNTER — Ambulatory Visit: Payer: 59 | Admitting: Internal Medicine

## 2021-05-14 ENCOUNTER — Encounter: Payer: 59 | Admitting: Gastroenterology

## 2021-07-02 ENCOUNTER — Other Ambulatory Visit: Payer: Self-pay

## 2021-07-02 ENCOUNTER — Encounter: Payer: Self-pay | Admitting: Internal Medicine

## 2021-07-02 ENCOUNTER — Ambulatory Visit (INDEPENDENT_AMBULATORY_CARE_PROVIDER_SITE_OTHER): Payer: 59 | Admitting: Internal Medicine

## 2021-07-02 VITALS — BP 132/78 | HR 62 | Temp 98.4°F | Resp 16 | Ht 66.0 in | Wt 179.0 lb

## 2021-07-02 DIAGNOSIS — K1370 Unspecified lesions of oral mucosa: Secondary | ICD-10-CM | POA: Diagnosis not present

## 2021-07-02 DIAGNOSIS — F419 Anxiety disorder, unspecified: Secondary | ICD-10-CM | POA: Diagnosis not present

## 2021-07-02 DIAGNOSIS — G4700 Insomnia, unspecified: Secondary | ICD-10-CM | POA: Diagnosis not present

## 2021-07-02 DIAGNOSIS — F32A Depression, unspecified: Secondary | ICD-10-CM

## 2021-07-02 NOTE — Patient Instructions (Addendum)
CALL IF A ORAL SURGEON REFERRAL IS NEEDED    Recommend to proceed with the following vaccines at your pharmacy:  Covid #4 Flu shot this fall  Check the  blood pressure   BP GOAL is between 110/65 and  135/85. If it is consistently higher or lower, let me know    GO TO THE FRONT DESK, PLEASE SCHEDULE YOUR APPOINTMENTS Come back for a check up in 6 months

## 2021-07-02 NOTE — Progress Notes (Signed)
Subjective:    Patient ID: Colton Mcconnell, male    DOB: 21-Sep-1961, 60 y.o.   MRN: [id number]  DOS:  07/02/2021 Type of visit - description: Acute visit  Patient went to his dentist, he complaining of dry mouth and pain at the jaw area. Was referred to oral surgeon, he recommended oral membrane biopsy.  The patient remains quite reluctant.  I noted weight loss, he reports it is on purpose.  Continue with a lot of stress   BP Readings from Last 3 Encounters:  07/02/21 132/78  12/28/20 (!) 156/92  08/31/20 112/77    Wt Readings from Last 3 Encounters:  07/02/21 179 lb (81.2 kg)  12/28/20 182 lb (82.6 kg)  08/31/20 178 lb 8 oz (81 kg)     Review of Systems See above   Past Medical History:  Diagnosis Date   Anxiety    Arthritis    neck, lower back   Back pain    sporadic   Disc degeneration, lumbosacral    at 4-5, 5-1   Erectile dysfunction    Facet arthritis of lumbosacral region    at 4-5 and neural foraminal stenosis   Maxillary sinusitis    Recurrent   Spinal stenosis    laminotomy defect at 4-5, 5-1, pars defect at 5    Past Surgical History:  Procedure Laterality Date   BACK SURGERY     cysts removed from spine age 27 and 56    Allergies as of 07/02/2021   No Known Allergies      Medication List        Accurate as of July 02, 2021  3:14 PM. If you have any questions, ask your nurse or doctor.          FLUoxetine 20 MG tablet Commonly known as: PROZAC Take 3 tablets (60 mg total) by mouth daily.   lamoTRIgine 100 MG tablet Commonly known as: LAMICTAL Take 1 tablet (100 mg total) by mouth 2 (two) times daily.   LORazepam 1 MG tablet Commonly known as: ATIVAN Take 1 tablet (1 mg total) by mouth 3 (three) times daily as needed for anxiety.   sildenafil 20 MG tablet Commonly known as: REVATIO Take 3-4 tablets (60-80 mg total) by mouth at bedtime as needed.   traZODone 50 MG tablet Commonly known as: DESYREL Take 50 mg by mouth  daily as needed.           Objective:   Physical Exam BP 132/78 (BP Location: Left Arm, Patient Position: Sitting, Cuff Size: Small)   Pulse 62   Temp 98.4 F (36.9 C) (Oral)   Resp 16   Ht '5\' 6"'$  (1.676 m)   Wt 179 lb (81.2 kg)   SpO2 95%   BMI 28.89 kg/m  General:   Well developed, NAD, BMI noted. HEENT:  Normocephalic . Face symmetric, atraumatic Oral membranes:  somewhat dry, at the  distal left palatopharyngeal arch I see an area that looks slightly erythematous and an area that looks white.   Has some irregularities also on the membrane on the R cheek. Mouth opening is indeed slightly limited. Parotid glands normal and not tender to palpation. Neck: No thyromegaly, no lymphadenopathies Neurologic:  alert & oriented X3.  Speech normal, gait appropriate for age and unassisted Psych--  Cognition and judgment appear intact.  Cooperative with normal attention span and concentration.  Behavior appropriate. No anxious or depressed appearing.      Assessment    Assessment Prediabetes  A1c 5.9 (06-2013) Anxiety  Use to see Dr. Candis Schatz, was unable to find a new psychiatrist ; now meds rx by pcp (since 2016) [facility] Back pain, saw Ortho 2016, had an MRI, dx spinal stenosis, s/p surgery 11-2016  GU: --ED  --H/o low testost, dx per urology, declined HRT or more testing --H/o Peyronie's Dz  DX'@urology'$  Sees derm regularly , had few lesions removed  +FH CAD Cabg age 58 DX with COVID  05/2020  PLAN Oral membranes abnormalities: Do rec  to proceed with a biopsy as recommended by a oral surgeon, he found a oral surgeon that accepts his insurance and plans to call him, to let me know if he needs a referral. Anxiety, depression, insomnia: Saw psychiatry 04/08/2021.  Still under a lot of stress, mostly financial however he did find a job that he enjoys. Does not need Ativan or Desyrel. Weight loss: Report it is on purpose.  Last TSH normal. Back pain: Still having back issues, to  have an MRI soon. Preventive care: Vaccines recommended RTC 6 months   This visit occurred during the SARS-CoV-2 public health emergency.  Safety protocols were in place, including screening questions prior to the visit, additional usage of staff PPE, and extensive cleaning of exam room while observing appropriate contact time as indicated for disinfecting solutions.

## 2021-07-03 NOTE — Assessment & Plan Note (Signed)
Oral membranes abnormalities: Do rec  to proceed with a biopsy as recommended by a oral surgeon, he found a oral surgeon that accepts his insurance and plans to call him, to let me know if he needs a referral. Anxiety, depression, insomnia: Saw psychiatry 04/08/2021.  Still under a lot of stress, mostly financial however he did find a job that he enjoys. Does not need Ativan or Desyrel. Weight loss: Report it is on purpose.  Last TSH normal. Back pain: Still having back issues, to have an MRI soon. Preventive care: Vaccines recommended RTC 6 months

## 2021-08-02 ENCOUNTER — Telehealth: Payer: Self-pay | Admitting: Internal Medicine

## 2021-08-02 NOTE — Telephone Encounter (Signed)
Pt dropped off document to be filled out by provider (1 page Spine & Scoliosis specialist document) Pt would like to have document faxed when ready to [phone]. Document put at front office tray under providers name.

## 2021-08-02 NOTE — Telephone Encounter (Signed)
Surgical clearance form received- Pt needing standalone L3-4 OLIF w/ Dr. Rennis Harding. Last EKG 2014- Pt will need surgical clearance appt.

## 2021-08-02 NOTE — Telephone Encounter (Signed)
Tried to call Pt to schedule appt- no answer, unable to leave message.

## 2021-08-08 NOTE — Telephone Encounter (Signed)
Pt called in regarding form, informed pt that he would need an appt. Scheduled pt for 10/17.

## 2021-08-08 NOTE — Telephone Encounter (Signed)
Tried calling Pt again- rang several times then went to a busy signal.

## 2021-08-12 ENCOUNTER — Encounter: Payer: Self-pay | Admitting: Internal Medicine

## 2021-08-12 ENCOUNTER — Ambulatory Visit (INDEPENDENT_AMBULATORY_CARE_PROVIDER_SITE_OTHER): Payer: 59 | Admitting: Internal Medicine

## 2021-08-12 ENCOUNTER — Other Ambulatory Visit: Payer: Self-pay

## 2021-08-12 VITALS — BP 122/82 | HR 57 | Temp 98.5°F | Resp 16 | Ht 66.0 in | Wt 177.5 lb

## 2021-08-12 DIAGNOSIS — M545 Low back pain, unspecified: Secondary | ICD-10-CM | POA: Diagnosis not present

## 2021-08-12 DIAGNOSIS — Z01818 Encounter for other preprocedural examination: Secondary | ICD-10-CM | POA: Diagnosis not present

## 2021-08-12 DIAGNOSIS — G8929 Other chronic pain: Secondary | ICD-10-CM | POA: Diagnosis not present

## 2021-08-12 NOTE — Patient Instructions (Addendum)
  GO TO THE LAB : Get the blood work     [place], [place] Come back for a physical exam by 12-2020

## 2021-08-12 NOTE — Progress Notes (Signed)
Subjective:    Patient ID: Colton Mcconnell, male    DOB: November 11, 1960, 60 y.o.   MRN: [id number]  DOS:  08/12/2021 Type of visit - description: Surgical clearance  Patient in need of back surgery. Needs clearance.  The back pain is intermittent, when he is not in pain he is able to exercise without any problems including hiking.  Denies chest pain, difficulty breathing. No palpitations or lower extremity edema.  Review of Systems See above   Past Medical History:  Diagnosis Date   Anxiety    Arthritis    neck, lower back   Back pain    sporadic   Disc degeneration, lumbosacral    at 4-5, 5-1   Erectile dysfunction    Facet arthritis of lumbosacral region    at 4-5 and neural foraminal stenosis   Maxillary sinusitis    Recurrent   Spinal stenosis    laminotomy defect at 4-5, 5-1, pars defect at 5    Past Surgical History:  Procedure Laterality Date   BACK SURGERY     cysts removed from spine age 89 and 68    Allergies as of 08/12/2021   No Known Allergies      Medication List        Accurate as of August 12, 2021  3:24 PM. If you have any questions, ask your nurse or doctor.          FLUoxetine 20 MG tablet Commonly known as: PROZAC Take 3 tablets (60 mg total) by mouth daily.   lamoTRIgine 100 MG tablet Commonly known as: LAMICTAL Take 1 tablet (100 mg total) by mouth 2 (two) times daily.   sildenafil 20 MG tablet Commonly known as: REVATIO Take 3-4 tablets (60-80 mg total) by mouth at bedtime as needed.           Objective:   Physical Exam BP 122/82 (BP Location: Left Arm, Patient Position: Sitting, Cuff Size: Small)   Pulse (!) 57   Temp 98.5 F (36.9 C) (Oral)   Resp 16   Ht 5\' 6"  (1.676 m)   Wt 177 lb 8 oz (80.5 kg)   SpO2 98%   BMI 28.65 kg/m  General:   Well developed, NAD, BMI noted.  HEENT:  Normocephalic . Face symmetric, atraumatic Neck: No JVD at 45 degrees Lungs:  CTA B Normal respiratory effort, no intercostal  retractions, no accessory muscle use. Heart: RRR,  no murmur.  Abdomen:  Not distended, soft, non-tender. No rebound or rigidity.   Skin: Not pale. Not jaundice Lower extremities: no pretibial edema bilaterally  Neurologic:  alert & oriented X3.  Speech normal, gait appropriate for age and unassisted Psych--  Cognition and judgment appear intact.  Cooperative with normal attention span and concentration.  Behavior appropriate. No anxious or depressed appearing.     Assessment     Assessment Prediabetes A1c 5.9 (06-2013) Anxiety  Use to see Dr. Candis Schatz, was unable to find a new psychiatrist ; now meds rx by pcp (since 2016) [facility] Back pain, saw Ortho 2016, had an MRI, dx spinal stenosis, s/p surgery 11-2016  GU: --ED  --H/o low testost, dx per urology, declined HRT or more testing --H/o Peyronie's Dz  DX@urology  Sees derm regularly , had few lesions removed  +FH CAD Cabg age 63 DX with COVID  05/2020  PLAN Preop: Patient has no cardiopulmonary symptoms, he is cleared to proceed with surgery, check BMP and CBC Back pain: In need of L3-L4 OLIF RTC  CPX 12-2021  This visit occurred during the SARS-CoV-2 public health emergency.  Safety protocols were in place, including screening questions prior to the visit, additional usage of staff PPE, and extensive cleaning of exam room while observing appropriate contact time as indicated for disinfecting solutions.

## 2021-08-13 LAB — CBC WITH DIFFERENTIAL/PLATELET
Basophils Absolute: 0 10*3/uL (ref 0.0–0.1)
Basophils Relative: 0.4 % (ref 0.0–3.0)
Eosinophils Absolute: 0.1 10*3/uL (ref 0.0–0.7)
Eosinophils Relative: 1.3 % (ref 0.0–5.0)
HCT: 43 % (ref 39.0–52.0)
Hemoglobin: 14.3 g/dL (ref 13.0–17.0)
Lymphocytes Relative: 19.7 % (ref 12.0–46.0)
Lymphs Abs: 1.3 10*3/uL (ref 0.7–4.0)
MCHC: 33.2 g/dL (ref 30.0–36.0)
MCV: 92.5 fl (ref 78.0–100.0)
Monocytes Absolute: 0.6 10*3/uL (ref 0.1–1.0)
Monocytes Relative: 9.1 % (ref 3.0–12.0)
Neutro Abs: 4.8 10*3/uL (ref 1.4–7.7)
Neutrophils Relative %: 69.5 % (ref 43.0–77.0)
Platelets: 220 10*3/uL (ref 150.0–400.0)
RBC: 4.65 Mil/uL (ref 4.22–5.81)
RDW: 13.7 % (ref 11.5–15.5)
WBC: 6.8 10*3/uL (ref 4.0–10.5)

## 2021-08-13 LAB — BASIC METABOLIC PANEL
BUN: 17 mg/dL (ref 6–23)
CO2: 26 mEq/L (ref 19–32)
Calcium: 9.7 mg/dL (ref 8.4–10.5)
Chloride: 100 mEq/L (ref 96–112)
Creatinine, Ser: 1 mg/dL (ref 0.40–1.50)
GFR: 81.09 mL/min (ref >60.00)
Glucose, Bld: 89 mg/dL (ref 70–99)
Potassium: 4.4 mEq/L (ref 3.5–5.1)
Sodium: 135 mEq/L (ref 135–145)

## 2021-08-13 NOTE — Assessment & Plan Note (Signed)
Preop: Patient has no cardiopulmonary symptoms, he is cleared to proceed with surgery, check BMP and CBC Back pain: In need of L3-L4 OLIF RTC CPX 12-2021

## 2021-08-13 NOTE — Telephone Encounter (Signed)
Pt cleared for surgery. Form, OV note, EKG and labs from 08/12/21 faxed to [facility] at [phone]. Form sent for scanning.

## 2021-08-13 NOTE — Telephone Encounter (Signed)
Received fax confirmation

## 2021-09-04 ENCOUNTER — Encounter: Payer: Self-pay | Admitting: Internal Medicine

## 2021-09-04 NOTE — Telephone Encounter (Signed)
Pt would like documents to be faxed to [facility] at [phone]. Fax was not received per facility. Please advise.

## 2021-09-05 ENCOUNTER — Encounter: Payer: Self-pay | Admitting: Internal Medicine

## 2021-09-05 NOTE — Telephone Encounter (Addendum)
Form has not made it to scan yet- letter written that Pt has been cleared. Re-faxed to number provided.

## 2021-09-05 NOTE — Telephone Encounter (Signed)
Spoke w/ Pt- informed that I have refaxed forms. Pt verbalized understanding.

## 2021-09-05 NOTE — Telephone Encounter (Signed)
Opened in error

## 2021-09-05 NOTE — Telephone Encounter (Signed)
Again received fax confirmation.

## 2022-01-10 ENCOUNTER — Ambulatory Visit: Payer: 59 | Admitting: Internal Medicine

## 2022-08-26 ENCOUNTER — Ambulatory Visit (INDEPENDENT_AMBULATORY_CARE_PROVIDER_SITE_OTHER): Payer: 59 | Admitting: Medical

## 2022-08-26 ENCOUNTER — Telehealth: Payer: Self-pay

## 2022-08-26 VITALS — BP 107/70 | HR 60 | Resp 18 | Ht 66.0 in | Wt 185.0 lb

## 2022-08-26 DIAGNOSIS — M25512 Pain in left shoulder: Secondary | ICD-10-CM

## 2022-08-26 NOTE — Telephone Encounter (Signed)
Nurse Assessment Nurse: Jeneen Rinks, RN, Janett Billow Date/Time Eilene Ghazi Time): 08/26/2022 7:12:26 AM Confirm and document reason for call. If symptomatic, describe symptoms. ---Caller states he hurt his arm at work last night. Left shoulder jammed at work; feels dislocated. Hurts to lift/ rotate arm. Pain radiating from shoulder and down arm; 8/10. Does the patient have any new or worsening symptoms? ---Yes Will a triage be completed? ---Yes Related visit to physician within the last 2 weeks? ---No Does the PT have any chronic conditions? (i.e. diabetes, asthma, this includes High risk factors for pregnancy, etc.) ---No Is this a behavioral health or substance abuse call? ---No Guidelines Guideline Title Affirmed Question Affirmed Notes Nurse Date/Time Eilene Ghazi Time) Shoulder Injury [1] SEVERE pain AND [2] not improved 2 hours after pain medicine/ ice packs Jeneen Rinks, RN, Janett Billow 08/26/2022 7:13:57 AM Disp. Time Eilene Ghazi Time) Disposition Final User 08/26/2022 7:16:32 AM See HCP within 4 Hours (or PCP triage) Yes Jeneen Rinks, RN, Janett Billow PLEASE NOTE: All timestamps contained within this report are represented as [place] Standard Time. CONFIDENTIALTY NOTICE: This fax transmission is intended only for the addressee. It contains information that is legally privileged, confidential or otherwise protected from use or disclosure. If you are not the intended recipient, you are strictly prohibited from reviewing, disclosing, copying using or disseminating any of this information or taking any action in reliance on or regarding this information. If you have received this fax in error, please notify us immediately by telephone so that we can arrange for its return to [place]. Phone: [phone], Toll-Free: [phone], Fax: [phone] Page: 2 of 2 Call Id: [id number] Final Disposition 08/26/2022 7:16:32 AM See HCP within 4 Hours (or PCP triage) Yes Jeneen Rinks, RN, Janet Berlin Disagree/Comply Comply Caller  Understands Yes PreDisposition Did not know what to do Care Advice Given Per Guideline SEE HCP (OR PCP TRIAGE) WITHIN 4 HOURS: * OFFICE: If patient sounds stable and not seriously ill, consult PCP (or follow your office policy) to see if patient can be seen NOW in office. SOURCES OF CARE: NOTE TO TRIAGER: * Use nurse judgment to select the most appropriate source of care. * Consider both the urgency of the patient's symptoms AND what resources may be needed to evaluate and manage the patient. FIRST AID ADVICE - SLING: * Use an arm sling to reduce the pain. USE A COLD PACK FOR PAIN, SWELLING, OR BRUISING: CALL BACK IF: * You become worse CARE ADVICE given per Shoulder Injury (Adult) guideline. Referrals REFERRED TO PCP OFFICE

## 2022-08-26 NOTE — Patient Instructions (Addendum)
Left shoulder pain. Acute onset. Some pain in bicep tendon area and reduced abduction/rom. Decided to go ahead and place referral to sport med MD.   Asking for quick appointment. Would recommend you call their office tomorrow and see when they can get you scheduled.  Recommend use low dose advil 200-400 mg every 8 hours as needed. Use your vicodin as needed for break thru pain.  Follow up date to be determined after sport med referral. If no appointment by early next week let me know.

## 2022-08-26 NOTE — Progress Notes (Signed)
Subjective:    Patient ID: Colton Mcconnell, male    DOB: 09/09/1961, 61 y.o.   MRN: [id number]  HPI   Pt states hurt his left shoulder at [employer]. He states package comes down on rollers. He states he was pushing a package and when he pushed felt sharp pain and felt pop sensation. Pt has anterior shoulder area pain, decreased rom and some left shoulder pain. This happened last night.   Pt states he tried to continue to work but could not.   Pt did talk to work and he filled out paperwork. Talked to supervisor.  Pt has some vicodin at home that he had left over for back surgery.   Review of Systems  Respiratory:  Negative for chest tightness, shortness of breath and wheezing.   Cardiovascular:  Negative for chest pain and palpitations.  Musculoskeletal:        Shoulder pain.    Past Medical History:  Diagnosis Date   Anxiety    Arthritis    neck, lower back   Back pain    sporadic   Disc degeneration, lumbosacral    at 4-5, 5-1   Erectile dysfunction    Facet arthritis of lumbosacral region    at 4-5 and neural foraminal stenosis   Maxillary sinusitis    Recurrent   Spinal stenosis    laminotomy defect at 4-5, 5-1, pars defect at 5     Social History   Socioeconomic History   Marital status: Married    Spouse name: Not on file   Number of children: 2   Years of education: Not on file   Highest education level: Not on file  Occupational History   Occupation: not working at present     Comment:    Tobacco Use   Smoking status: Never   Smokeless tobacco: Never  Substance and Sexual Activity   Alcohol use: No    Alcohol/week: 10.0 standard drinks of alcohol    Types: 10 Cans of beer per week    Comment:  drinks twice a week   Drug use: No   Sexual activity: Yes  Other Topics Concern   Not on file  Social History Narrative   Household: Pt and wife   2 boys- adults   2nd wife has 2 daughters, independent    Social Determinants of Systems developer Strain: Not on file  Food Insecurity: Not on file  Transportation Needs: Not on file  Physical Activity: Not on file  Stress: Not on file  Social Connections: Not on file  Intimate Partner Violence: Not on file    Past Surgical History:  Procedure Laterality Date   BACK SURGERY     cysts removed from spine age 50 and 83    Family History  Problem Relation Age of Onset   CAD Father        cabg at age 14, currently 26   Hypertension Father    Diabetes Father    Parkinson's disease Father    Dementia Father    Breast cancer Mother    Alcohol abuse Other        many family members    Colon cancer Neg Hx    Prostate cancer Neg Hx    Colon polyps Neg Hx    Rectal cancer Neg Hx    Stomach cancer Neg Hx    Esophageal cancer Neg Hx     No Known Allergies  Current Outpatient Medications  on File Prior to Visit  Medication Sig Dispense Refill   FLUoxetine (PROZAC) 20 MG tablet Take 3 tablets (60 mg total) by mouth daily. 90 tablet 0   lamoTRIgine (LAMICTAL) 100 MG tablet Take 1 tablet (100 mg total) by mouth 2 (two) times daily. 180 tablet 3   sildenafil (REVATIO) 20 MG tablet Take 3-4 tablets (60-80 mg total) by mouth at bedtime as needed. 30 tablet 3   No current facility-administered medications on file prior to visit.    BP 107/70   Pulse 60   Resp 18   Ht '5\' 6"'$  (1.676 m)   Wt 185 lb (83.9 kg)   SpO2 98%   BMI 29.86 kg/m        Objective:   Physical Exam  General- No acute distress. Pleasant patient. Neck- Full range of motion, no jvd Lungs- Clear, even and unlabored. Heart- regular rate and rhythm. Neurologic- CNII- XII grossly intact.   Left shoulder- anterior aspect and bicep tendon area tender. Reduced abdudction. Left trapezius tender.      Assessment & Plan:   Patient Instructions  Left shoulder pain. Acute onset. Some pain in bicep tendon area and reduced abduction. Decided to go ahead and place referral to sport med MD.   Asking for  quick appointment. Would recommend you call their office tomorrow and see when they can get you scheduled.  Recommend use low dose advil 200-400 mg every 8 hours as needed. Use your vicodin as needed for break thru pain.  Follow up date to be determined after sport med referral. If no appointment by early next week let me know.    Mackie Pai, PA-C

## 2022-08-26 NOTE — Telephone Encounter (Signed)
Appt w/ Edward today.  

## 2022-09-15 ENCOUNTER — Encounter: Payer: Self-pay | Admitting: Internal Medicine

## 2023-01-16 ENCOUNTER — Ambulatory Visit (INDEPENDENT_AMBULATORY_CARE_PROVIDER_SITE_OTHER): Payer: 59 | Admitting: Internal Medicine

## 2023-01-16 ENCOUNTER — Encounter: Payer: Self-pay | Admitting: Internal Medicine

## 2023-01-16 VITALS — BP 130/70 | HR 78 | Temp 97.9°F | Resp 16 | Ht 66.0 in | Wt 190.1 lb

## 2023-01-16 DIAGNOSIS — K1370 Unspecified lesions of oral mucosa: Secondary | ICD-10-CM

## 2023-01-16 DIAGNOSIS — M350C Sjogren syndrome with dental involvement: Secondary | ICD-10-CM | POA: Diagnosis not present

## 2023-01-16 NOTE — Progress Notes (Unsigned)
Subjective:    Patient ID: Colton Mcconnell, male    DOB: Jun 07, 1961, 62 y.o.   MRN: [id number]  DOS:  01/16/2023 Type of visit - description: Acute  Here after a prolonged absence. Continue to be concerned about oral lesions. They are there all the time, occasionally gets irritated and hurt. He wonders about out immune disease or other conditions. She continues to have severe dry mouth.  No fever chills No nausea vomiting No dysuria or GU ulcers or discharge. No eye symptoms. Never smoker. No unusual aches or pains [place] chronic low back pain. Occasionally has some lower extremity edema (sock marks) without chest pain or difficulty breathing   Review of Systems See above   Past Medical History:  Diagnosis Date   Anxiety    Arthritis    neck, lower back   Back pain    sporadic   Disc degeneration, lumbosacral    at 4-5, 5-1   Erectile dysfunction    Facet arthritis of lumbosacral region    at 4-5 and neural foraminal stenosis   Maxillary sinusitis    Recurrent   Spinal stenosis    laminotomy defect at 4-5, 5-1, pars defect at 5    Past Surgical History:  Procedure Laterality Date   BACK SURGERY     cysts removed from spine age 64 and 23    Current Outpatient Medications  Medication Instructions   ARIPiprazole (ABILIFY) 2 mg, Oral, Daily   FLUoxetine (PROZAC) 60 mg, Oral, Daily   lamoTRIgine (LAMICTAL) 200 mg, Oral, Daily   sildenafil (REVATIO) 60-80 mg, Oral, At bedtime PRN       Objective:   Physical Exam BP 130/70   Pulse 78   Temp 97.9 F (36.6 C) (Oral)   Resp 16   Ht 5\' 6"  (1.676 m)   Wt 190 lb 2 oz (86.2 kg)   SpO2 97%   BMI 30.69 kg/m  General:   Well developed, NAD, BMI noted. HEENT:  Normocephalic . Face symmetric, atraumatic Oral membranes: Has induration plaques at both sides of the mouth, on the right it seems slightly erythematous, on the left looks pain thicker. Has some swelling and erythema (chronic) at the inner aspect of the  lower lip right side. Neck: No thyromegaly, no lymphadenopathy, no increased size of the parotid glands. Lungs:  CTA B Normal respiratory effort, no intercostal retractions, no accessory muscle use. Heart: RRR,  no murmur.  Lower extremities: no pretibial edema bilaterally  Skin: Not pale. Not jaundice Neurologic:  alert & oriented X3.  Speech normal, gait appropriate for age and unassisted Psych--  Cognition and judgment appear intact.  Cooperative with normal attention span and concentration.  Behavior appropriate. No anxious or depressed appearing.      Assessment     Assessment Prediabetes A1c 5.9 (06-2013) Anxiety  Use to see Dr. Candis Schatz, was unable to find a new psychiatrist ; now meds rx by pcp (since 2016) [facility] Back pain, saw Ortho 2016, had an MRI, dx spinal stenosis, s/p surgery 11-2016  GU: --ED  --H/o low testost, dx per urology, declined HRT or more testing --H/o Peyronie's Dz  DX@urology  Sees derm regularly , had few lesions removed  +FH CAD Cabg age 7  PLAN Oral membrane abnormalities, Sjogren's?. The patient has a long history of dry mouth associated with oral membranes abnormalities as described above. Was seen previously by a oral surgeon and recommended a biopsy but never happened. Plan: Refer to ENT for possible oral membrane  biopsy. Sjogren?  Will check CMP CBC sed rate ANA RF, 123456, folic acid.  Referred to rheumatology.  Recommend to RTC for a CPX in 2 to 3 months.     07-2021 Preop: Patient has no cardiopulmonary symptoms, he is cleared to proceed with surgery, check BMP and CBC Back pain: In need of L3-L4 OLIF RTC CPX 12-2021

## 2023-01-16 NOTE — Patient Instructions (Addendum)
We are referring you to: Ear nose and throat (ENT) Rheumatology.  Sjogren?  GO TO THE LAB : Get the blood work     [place], [place] Come back for     a physical exam at your earliest convenience.

## 2023-01-17 LAB — URINALYSIS, ROUTINE W REFLEX MICROSCOPIC
Bilirubin Urine: NEGATIVE
Glucose, UA: NEGATIVE
Hgb urine dipstick: NEGATIVE
Ketones, ur: NEGATIVE
Leukocytes,Ua: NEGATIVE
Nitrite: NEGATIVE
Protein, ur: NEGATIVE
Specific Gravity, Urine: 1.028 (ref 1.001–1.035)
pH: 5.5 (ref 5.0–8.0)

## 2023-01-17 NOTE — Assessment & Plan Note (Signed)
Oral membrane abnormalities. Sjogren's?. The patient has a long history of dry mouth associated with oral membranes abnormalities as described above. Was seen previously by a oral surgeon and recommended a biopsy but that never happened. Plan: Refer to ENT for further eval -- oral membrane bx? Sjogren?  Will check CMP CBC sed rate ANA RF, 123456, folic acid.  Referred to rheumatology. Recommend to RTC for a CPX in 2 to 3 months.

## 2023-01-18 LAB — COMPREHENSIVE METABOLIC PANEL
AG Ratio: 1.8 (calc) (ref 1.0–2.5)
ALT: 23 U/L (ref 9–46)
AST: 25 U/L (ref 10–35)
Albumin: 4.4 g/dL (ref 3.6–5.1)
Alkaline phosphatase (APISO): 67 U/L (ref 35–144)
BUN: 20 mg/dL (ref 7–25)
CO2: 22 mmol/L (ref 20–32)
Calcium: 9.7 mg/dL (ref 8.6–10.3)
Chloride: 104 mmol/L (ref 98–110)
Creat: 0.94 mg/dL (ref 0.70–1.35)
Globulin: 2.4 g/dL (calc) (ref 1.9–3.7)
Glucose, Bld: 97 mg/dL (ref 65–99)
Potassium: 4.4 mmol/L (ref 3.5–5.3)
Sodium: 140 mmol/L (ref 135–146)
Total Bilirubin: 0.3 mg/dL (ref 0.2–1.2)
Total Protein: 6.8 g/dL (ref 6.1–8.1)

## 2023-01-18 LAB — CBC WITH DIFFERENTIAL/PLATELET
Absolute Monocytes: 609 cells/uL (ref 200–950)
Basophils Absolute: 21 cells/uL (ref 0–200)
Basophils Relative: 0.3 %
Eosinophils Absolute: 77 cells/uL (ref 15–500)
Eosinophils Relative: 1.1 %
HCT: 45.3 % (ref 38.5–50.0)
Hemoglobin: 15.5 g/dL (ref 13.2–17.1)
Lymphs Abs: 1323 cells/uL (ref 850–3900)
MCH: 31.1 pg (ref 27.0–33.0)
MCHC: 34.2 g/dL (ref 32.0–36.0)
MCV: 91 fL (ref 80.0–100.0)
MPV: 10.1 fL (ref 7.5–12.5)
Monocytes Relative: 8.7 %
Neutro Abs: 4970 cells/uL (ref 1500–7800)
Neutrophils Relative %: 71 %
Platelets: 212 10*3/uL (ref 140–400)
RBC: 4.98 10*6/uL (ref 4.20–5.80)
RDW: 12.9 % (ref 11.0–15.0)
Total Lymphocyte: 18.9 %
WBC: 7 10*3/uL (ref 3.8–10.8)

## 2023-01-18 LAB — B12 AND FOLATE PANEL
Folate: 19.6 ng/mL
Vitamin B-12: 439 pg/mL (ref 200–1100)

## 2023-01-18 LAB — ANA: Anti Nuclear Antibody (ANA): POSITIVE — AB

## 2023-01-18 LAB — ANTI-NUCLEAR AB-TITER (ANA TITER): ANA Titer 1: 1:40 {titer} — ABNORMAL HIGH

## 2023-01-18 LAB — SEDIMENTATION RATE: Sed Rate: 2 mm/h (ref 0–20)

## 2023-01-18 LAB — RHEUMATOID FACTOR: Rheumatoid fact SerPl-aCnc: <14 IU/mL (ref <14)

## 2023-04-03 ENCOUNTER — Encounter: Payer: Self-pay | Admitting: Internal Medicine

## 2023-04-03 ENCOUNTER — Ambulatory Visit (INDEPENDENT_AMBULATORY_CARE_PROVIDER_SITE_OTHER): Payer: 59 | Admitting: Internal Medicine

## 2023-04-03 VITALS — BP 126/62 | HR 76 | Temp 98.2°F | Resp 16 | Ht 66.0 in | Wt 185.2 lb

## 2023-04-03 DIAGNOSIS — R739 Hyperglycemia, unspecified: Secondary | ICD-10-CM

## 2023-04-03 DIAGNOSIS — Z1211 Encounter for screening for malignant neoplasm of colon: Secondary | ICD-10-CM

## 2023-04-03 DIAGNOSIS — Z0000 Encounter for general adult medical examination without abnormal findings: Secondary | ICD-10-CM | POA: Diagnosis not present

## 2023-04-03 MED ORDER — SILDENAFIL CITRATE 20 MG PO TABS: 60.0000 mg | ORAL_TABLET | Freq: Every evening | ORAL | 3 refills | Status: DC | PRN

## 2023-04-03 NOTE — Patient Instructions (Addendum)
We are referring you to a gastroenterologist.  Please reach them at [phone] and schedule a colonoscopy   Vaccines I recommend:  Covid booster Tdap (tetanus) Shingrix (shingles) RSV vaccine Flu shot every fall    GO TO THE LAB : Get the blood work     GO TO THE FRONT DESK, PLEASE SCHEDULE YOUR APPOINTMENTS Come back for   a physical exam in 1 year    "Health Care Power of attorney" ,  "Living will" (Advance care planning documents)  If you already have a living will or healthcare power of attorney, is recommended you bring the copy to be scanned in your chart.   The document will be available to all the doctors you see in the system.  Advance care planning is a process that supports adults in  understanding and sharing their preferences regarding future medical care.  The patient's preferences are recorded in documents called Advance Directives and the can be modified at any time while the patient is in full mental capacity.   If you don't have one, please consider create one.      More information at: [email or web address]

## 2023-04-03 NOTE — Progress Notes (Unsigned)
Subjective:    Patient ID: Colton Mcconnell, male    DOB: 1961-01-10, 62 y.o.   MRN: [id number]  DOS:  04/03/2023 Type of visit - description: cpx   Here for CPX Chronic, on and off lumbalgia at baseline.  Review of Systems See above   Past Medical History:  Diagnosis Date   Anxiety    Arthritis    neck, lower back   Back pain    sporadic   Disc degeneration, lumbosacral    at 4-5, 5-1   Erectile dysfunction    Facet arthritis of lumbosacral region    at 4-5 and neural foraminal stenosis   Maxillary sinusitis    Recurrent   Spinal stenosis    laminotomy defect at 4-5, 5-1, pars defect at 5    Past Surgical History:  Procedure Laterality Date   BACK SURGERY     cysts removed from spine age 34 and 37    Current Outpatient Medications  Medication Instructions   ARIPiprazole (ABILIFY) 2 mg, Oral, Daily   FLUoxetine (PROZAC) 60 mg, Oral, Daily   lamoTRIgine (LAMICTAL) 200 mg, Oral, Daily   sildenafil (REVATIO) 60-80 mg, Oral, At bedtime PRN       Objective:   Physical Exam BP 126/62   Pulse 76   Temp 98.2 F (36.8 C) (Oral)   Resp 16   Ht 5\' 6"  (1.676 m)   Wt 185 lb 4 oz (84 kg)   SpO2 95%   BMI 29.90 kg/m  General: Well developed, NAD, BMI noted Neck: No  thyromegaly  HEENT:  Normocephalic . Face symmetric, atraumatic.  Amblyopia noted Lungs:  CTA B Normal respiratory effort, no intercostal retractions, no accessory muscle use. Heart: RRR,  no murmur.  Abdomen:  Not distended, soft, non-tender. No rebound or rigidity.   Lower extremities: no pretibial edema bilaterally  Skin: Exposed areas without rash. Not pale. Not jaundice Neurologic:  alert & oriented X3.  Speech normal, gait appropriate for age and unassisted Strength symmetric and appropriate for age.  Psych: Cognition and judgment appear intact.  Cooperative with normal attention span and concentration.  Behavior appropriate. No anxious or depressed appearing.     Assessment    Assessment Prediabetes A1c 5.9 (06-2013) Anxiety per psych Use to see Dr. Tomasa Mcconnell  Back pain, saw Ortho 2016, had an MRI, dx spinal stenosis, s/p surgery 11-2016  GU: --ED  --H/o low testost, dx per urology, declined HRT or more testing --H/o Peyronie's Dz  DX@urology  Sees derm regularly , had few lesions removed  Amblyopia +FH CAD Cabg age 69  PLAN Here for CPX   - Td 2014 - Vaccines I recommend: RSV, Shingrix, COVID booster, flu shot.  Benefits discussed. -CCS: Cscope at age 28 per pt, again 09-2015, 5 years , refer to GI -Prostate ca screening: No symptoms, check PSA  Labs : FLP A1c TSH PSA -Diet and exercise: Very active, loading trucks 5 hours a day. -Healthcare POA: Information provided.   Prediabetes: Check A1c Anxiety: Managed [facility] for, reports she is doing okay. ED: Needs Viagra refilled.  Sent  Oral membranes lesions: Saw ENT 10/30/2021, Dx aphthous ulcers versus lichen planus, biopsy if not better, was Rx prednisone.  Has seen ENT a couple of other times and he is scheduled for a biopsy 04/22/2023. RTC 1 year   Oral membrane abnormalities. Sjogren's?. The patient has a long history of dry mouth associated with oral membranes abnormalities as described above. Was seen previously by a oral surgeon  and recommended a biopsy but that never happened. Plan: Refer to ENT for further eval -- oral membrane bx? Sjogren?  Will check CMP CBC sed rate ANA RF, B12, folic acid.  Referred to rheumatology. Recommend to RTC for a CPX in 2 to 3 months.

## 2023-04-04 LAB — HEMOGLOBIN A1C
Hgb A1c MFr Bld: 5.8 % of total Hgb — ABNORMAL HIGH (ref <5.7)
Mean Plasma Glucose: 120 mg/dL
eAG (mmol/L): 6.6 mmol/L

## 2023-04-04 LAB — LIPID PANEL
Cholesterol: 162 mg/dL (ref <200)
HDL: 71 mg/dL (ref >=40)
LDL Cholesterol (Calc): 71 mg/dL (calc)
Non-HDL Cholesterol (Calc): 91 mg/dL (calc) (ref <130)
Total CHOL/HDL Ratio: 2.3 (calc) (ref <5.0)
Triglycerides: 110 mg/dL (ref <150)

## 2023-04-04 LAB — TSH: TSH: 1.5 mIU/L (ref 0.40–4.50)

## 2023-04-04 LAB — PSA: PSA: 0.45 ng/mL (ref <=4.00)

## 2023-04-05 ENCOUNTER — Encounter: Payer: Self-pay | Admitting: Internal Medicine

## 2023-04-05 NOTE — Assessment & Plan Note (Signed)
-   Td 2014 - Vaccines I recommend: RSV, Shingrix, COVID booster, flu shot.  Benefits discussed. -CCS: Cscope at age 62 per pt, again 09-2015, 5 years , refer to GI -Prostate ca screening: No symptoms, check PSA  - Labs : FLP A1c TSH PSA -Diet and exercise: Very active, loading trucks 5 hours a day. -Healthcare POA: Information provided.

## 2023-04-05 NOTE — Assessment & Plan Note (Addendum)
Here for CPX Prediabetes: Check A1c Anxiety: Managed elsewhere, reports  is doing okay. ED: Needs Viagra refilled.  Sent Oral membranes lesions: Per ENT  scheduled for a biopsy 04/22/2023. RTC 1 year

## 2023-05-18 ENCOUNTER — Telehealth: Payer: Self-pay | Admitting: Internal Medicine

## 2023-05-18 MED ORDER — SILDENAFIL CITRATE 20 MG PO TABS: 60.0000 mg | ORAL_TABLET | Freq: Every evening | ORAL | 3 refills | Status: DC | PRN

## 2023-05-18 NOTE — Telephone Encounter (Signed)
Rx resent.

## 2023-05-18 NOTE — Telephone Encounter (Signed)
Pt states pharmacy has not received rx and he would like it resent elsewhere.   sildenafil (REVATIO) 20 MG tablet  [facility] [address], [place] [address]

## 2023-06-10 ENCOUNTER — Ambulatory Visit: Payer: 59 | Attending: Internal Medicine | Admitting: Internal Medicine

## 2023-06-10 ENCOUNTER — Encounter: Payer: Self-pay | Admitting: Internal Medicine

## 2023-06-10 VITALS — BP 92/57 | HR 61 | Resp 12 | Ht 65.5 in | Wt 183.0 lb

## 2023-06-10 DIAGNOSIS — R682 Dry mouth, unspecified: Secondary | ICD-10-CM | POA: Diagnosis not present

## 2023-06-10 DIAGNOSIS — R768 Other specified abnormal immunological findings in serum: Secondary | ICD-10-CM | POA: Diagnosis not present

## 2023-06-10 DIAGNOSIS — K121 Other forms of stomatitis: Secondary | ICD-10-CM | POA: Diagnosis not present

## 2023-06-10 NOTE — Progress Notes (Signed)
Office Visit Note  Patient: Colton Mcconnell             Date of Birth: [birth year]           MRN: [id number]             PCP: Wanda Plump, MD Referring: Wanda Plump, MD Visit Date: 06/10/2023 Occupation: Loss adjuster, chartered  Subjective:  New Patient (Initial Visit) (Patient states he has some back pain but that is due to a surgery. Patient states he has mouth lesions and had a biopsy done recently. )   History of Present Illness: Colton Mcconnell is a 62 y.o. male here for evaluation of dry mouth with pain and mucosal ulcers with positive ANA and family history of sjogren syndrome.  Symptoms started since about 1.5 years ago.  Started fairly abruptly though he did not recall any preceding major illness injuries or other medical event.  He was sick with COVID in the preceding year but did not have any severe or prolonged symptoms.  Describes persistent dryness and often a burning and irritated sensation especially on the sides of the tongue.  Ulcerated patches on the buccal mucosa initial impression with his dermatologist was for lichen planus and treated with some topical steroid without significant benefit.  Lab test checked at primary care office earlier this year showed a low positive ANA titer.  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  Currently he is drinking a lot of water for the symptoms.  Had discussed use of sugar-free gum or lozenges as saliva stimulating option but not doing this regularly.  Does not have any associated eye dryness pain or vision change.  No nodules or swelling noticeable in his neck or under the arms.  No new fingernail changes.  He had a few patches of red discoloration on his right shin several months ago.  These were not raised and not painful and resolved on their own after about  6 months total duration.  No involvement of the left leg no associated leg swelling.  Has not seen any other rash or skin changes and no discoloration in his fingers or toes.  Labs reviewed 12/2022 ANA 1:40 RF neg ESR 2  Pathology 05/11/23 A. SALIVARY GLANAD MINOR, BIOPSY:  Minor salivary gland with focal lymphocytic sialadenitis, see comment.   B. BUCCAL MUCOSA, RIGHT, BIOPSY:  Squamous mucosa with high-grade dysplasia.  Associated acute inflammation. Lichenoid mucositis. Negative for invasive carcinoma.    Activities of Daily Living:  Patient reports morning stiffness for 30 minutes.   Patient Reports nocturnal pain.  Difficulty dressing/grooming: Denies Difficulty climbing stairs: Denies Difficulty getting out of chair: Denies Difficulty using hands for taps, buttons, cutlery, and/or writing: Denies  Review of Systems  Constitutional:  Negative for fatigue.  HENT:  Positive for mouth sores. Negative for mouth dryness.   Eyes:  Negative for dryness.  Respiratory:  Negative for shortness of breath.   Cardiovascular:  Negative for chest pain and palpitations.  Gastrointestinal:  Negative for blood in stool, constipation and diarrhea.  Endocrine: Negative for increased urination.  Genitourinary:  Negative for involuntary urination.  Musculoskeletal:  Positive for joint pain, joint pain, myalgias, morning stiffness and myalgias. Negative for gait problem, joint swelling, muscle weakness and muscle tenderness.  Skin:  Negative for color change, rash, hair loss and sensitivity to  sunlight.  Allergic/Immunologic: Negative for susceptible to infections.  Neurological:  Negative for dizziness and headaches.  Hematological:  Negative for swollen glands.  Psychiatric/Behavioral:  Negative for depressed mood and sleep disturbance. The patient is nervous/anxious.     PMFS History:  Patient Active Problem List   Diagnosis Date Noted   Positive ANA (antinuclear antibody) 06/10/2023    Mouth ulcers 06/10/2023   Dry mouth 06/10/2023   Insomnia 09/01/2020   Shingles dx 02/2019 03/21/2019   Postlaminectomy syndrome 12/11/2016   Spondylolisthesis of lumbosacral region 12/11/2016   PCP NOTES >>>>>>>>>>>>>>>>> 12/10/2016   Hyperglycemia 08/01/2014   Annual physical exam 07/25/2013   Anxiety and depression    Erectile dysfunction     Past Medical History:  Diagnosis Date   Anxiety    Arthritis    neck, lower back   Back pain    sporadic   Disc degeneration, lumbosacral    at 4-5, 5-1   Erectile dysfunction    Facet arthritis of lumbosacral region    at 4-5 and neural foraminal stenosis   Maxillary sinusitis    Recurrent   Mouth lesion    Spinal stenosis    laminotomy defect at 4-5, 5-1, pars defect at 4    Family History  Problem Relation Age of Onset   Breast cancer Mother    CAD Father        cabg at age 93, currently 62   Hypertension Father    Diabetes Father    Parkinson's disease Father    Dementia Father    Alcohol abuse Other        many family members    Colon cancer Neg Hx    Prostate cancer Neg Hx    Colon polyps Neg Hx    Rectal cancer Neg Hx    Stomach cancer Neg Hx    Esophageal cancer Neg Hx    Past Surgical History:  Procedure Laterality Date   BACK SURGERY     cysts removed from spine age 59 and 69   BACK SURGERY  2022   L3-L4   Social History   Social History Narrative   Household: Pt and wife   2 boys- adults   2nd wife has 2 daughters, independent    Immunization History  Administered Date(s) Administered   PFIZER(Purple Top)SARS-COV-2 Vaccination 01/14/2020, 02/11/2020, 11/21/2020   Tdap 07/25/2013     Objective: Vital Signs: BP (!) 92/57 (BP Location: Right Arm, Patient Position: Sitting, Cuff Size: Normal)   Pulse 61   Resp 12   Ht 5' 5.5" (1.664 m)   Wt 183 lb (83 kg)   BMI 29.99 kg/m    Physical Exam HENT:     Mouth/Throat:     Mouth: Mucous membranes are dry.     Comments: Buccal mucosal flat  ulcers both sides Top side of tongue, roof of mouth normal Eyes:     Conjunctiva/sclera: Conjunctivae normal.  Cardiovascular:     Rate and Rhythm: Normal rate and regular rhythm.  Pulmonary:     Effort: Pulmonary effort is normal.     Breath sounds: Normal breath sounds.  Lymphadenopathy:     Cervical: No cervical adenopathy.  Skin:    General: Skin is warm and dry.  Neurological:     Mental Status: He is alert.  Psychiatric:        Mood and Affect: Mood normal.      Musculoskeletal Exam:  Shoulders full ROM no tenderness or swelling Elbows full ROM  no tenderness or swelling Wrists full ROM no tenderness or swelling Fingers full ROM no tenderness or swelling, mild bony changes 1st CMC joints and heberdon's nodes Knees full ROM no tenderness or swelling   Investigation: No additional findings.  Imaging: No results found.  Recent Labs: Lab Results  Component Value Date   WBC 7.0 01/16/2023   HGB 15.5 01/16/2023   PLT 212 01/16/2023   NA 140 01/16/2023   K 4.4 01/16/2023   CL 104 01/16/2023   CO2 22 01/16/2023   GLUCOSE 97 01/16/2023   BUN 20 01/16/2023   CREATININE 0.94 01/16/2023   BILITOT 0.3 01/16/2023   ALKPHOS 47 12/28/2020   AST 25 01/16/2023   ALT 23 01/16/2023   PROT 6.8 01/16/2023   ALBUMIN 4.4 12/28/2020   CALCIUM 9.7 01/16/2023    Speciality Comments: No specialty comments available.  Procedures:  No procedures performed Allergies: Patient has no known allergies.   Assessment / Plan:     Visit Diagnoses: Positive ANA (antinuclear antibody) - Plan: Sjogrens syndrome-A extractable nuclear antibody, Sjogrens syndrome-B extractable nuclear antibody, RNP Antibody, Anti-DNA antibody, double-stranded, C3 and C4  Negative mucosal biopsy does have chronic dry mouth low positive ANA and family history.  Will check specific antibody markers also screening lupus and serum complements.  I have a low pretest suspicion given the negative biopsy but can have  sampling error.  So if positive will follow-up to discuss treatment options. If results come back negative would recommend him to follow-up with dermatology as next step for a localized process.  Mouth ulcers Dry mouth  Reviewed nonprescription treatments for chronic dry mouth including hydration status, sugar-free gum or lozenges for stimulating saliva production.  Could consider use of oral secretagogue if dryness is refractory and underlying inflammatory problem better controlled.  Orders: Orders Placed This Encounter  Procedures   Sjogrens syndrome-A extractable nuclear antibody   Sjogrens syndrome-B extractable nuclear antibody   RNP Antibody   Anti-DNA antibody, double-stranded   C3 and C4   No orders of the defined types were placed in this encounter.   Follow-Up Instructions: No follow-ups on file.   Fuller Plan, MD  Note - This record has been created using [employer].  Chart creation errors have been sought, but may not always  have been located. Such creation errors do not reflect on  the standard of medical care.

## 2023-06-11 LAB — RNP ANTIBODY: Ribonucleic Protein(ENA) Antibody, IgG: <1 AI

## 2023-06-11 LAB — SJOGRENS SYNDROME-B EXTRACTABLE NUCLEAR ANTIBODY: SSB (La) (ENA) Antibody, IgG: <1 AI

## 2023-06-11 LAB — C3 AND C4
C3 Complement: 113 mg/dL (ref 82–185)
C4 Complement: 19 mg/dL (ref 15–53)

## 2023-06-11 LAB — SJOGRENS SYNDROME-A EXTRACTABLE NUCLEAR ANTIBODY: SSA (Ro) (ENA) Antibody, IgG: <1 AI

## 2023-06-11 LAB — ANTI-DNA ANTIBODY, DOUBLE-STRANDED: ds DNA Ab: <1 [IU]/mL

## 2023-06-12 NOTE — Progress Notes (Signed)
Lab results were negative for any other systemic evidence of sjogren syndrome. He will need to follow up with his dermatologist for this if there is ongoing local inflammation.

## 2023-09-03 ENCOUNTER — Encounter: Payer: Self-pay | Admitting: Internal Medicine

## 2023-10-22 ENCOUNTER — Encounter: Payer: Self-pay | Admitting: Family Medicine

## 2023-10-22 ENCOUNTER — Ambulatory Visit: Payer: 59 | Admitting: Family Medicine

## 2023-10-22 VITALS — BP 111/66 | HR 60 | Temp 97.9°F | Ht 65.5 in | Wt 186.0 lb

## 2023-10-22 DIAGNOSIS — J0140 Acute pansinusitis, unspecified: Secondary | ICD-10-CM

## 2023-10-22 MED ORDER — AMOXICILLIN-POT CLAVULANATE 875-125 MG PO TABS: 1.0000 | ORAL_TABLET | Freq: Two times a day (BID) | ORAL | 0 refills | Status: AC

## 2023-10-22 NOTE — Progress Notes (Signed)
Acute Office Visit  Subjective:     Patient ID: Colton Mcconnell, male    DOB: 05/09/1961, 62 y.o.   MRN: [id number]  Chief Complaint  Patient presents with   Sinus Problem     Patient is in today for sinusitis.   Discussed the use of AI scribe software for clinical note transcription with the patient, who gave verbal consent to proceed.  History of Present Illness   The patient, with a history of hypertension, presented with symptoms suggestive of a sinus infection. They reported feeling unwell for approximately a week, with symptoms initially resembling a common cold. The patient described a sensation of pressure and congestion in both the upper and lower sinuses, which was exacerbated during sleep, depending on their position. They denied any associated cough, wheezing, shortness of breath, ear pain, or sore throat. The sinus pain was rated as a 6 out of 10 in severity, and was described as bothersome but manageable.  The patient had been self-managing with over-the-counter medications including Mucinex and DayQuil/Nyquil. They also reported using Afrin nasal spray at night to alleviate the congestion, but were aware of the potential for dependency with prolonged use.  The patient had not been exposed to any known sick contacts and had tested negative for COVID-19 at the onset of symptoms. They had not recently taken any antibiotics and reported no known allergies.              ROS All review of systems negative except what is listed in the HPI      Objective:    BP 111/66   Pulse 60   Temp 97.9 F (36.6 C) (Oral)   Ht 5' 5.5" (1.664 m)   Wt 186 lb (84.4 kg)   SpO2 98%   BMI 30.48 kg/m    Physical Exam Vitals reviewed.  Constitutional:      Appearance: Normal appearance.  HENT:     Head: Normocephalic and atraumatic.     Comments: Bilateral maxillary sinuses tender to palpation     Right Ear: Tympanic membrane normal.     Left Ear: Tympanic membrane  normal.     Nose: Congestion present.     Mouth/Throat:     Mouth: Mucous membranes are moist.     Pharynx: No posterior oropharyngeal erythema.     Comments: Significant postnasal drainage (left) Cardiovascular:     Rate and Rhythm: Normal rate and regular rhythm.  Pulmonary:     Effort: Pulmonary effort is normal.     Breath sounds: Normal breath sounds.  Lymphadenopathy:     Cervical: No cervical adenopathy.  Skin:    General: Skin is warm and dry.  Neurological:     Mental Status: He is alert and oriented to person, place, and time.  Psychiatric:        Mood and Affect: Mood normal.        Behavior: Behavior normal.        Thought Content: Thought content normal.        Judgment: Judgment normal.     No results found for any visits on 10/22/23.      Assessment & Plan:   Problem List Items Addressed This Visit   None Visit Diagnoses       Acute non-recurrent pansinusitis    -  Primary   Relevant Medications   amoxicillin-clavulanate (AUGMENTIN) 875-125 MG tablet       Persistent sinus congestion and pressure for one week, with significant nasal  drainage. No associated cough, wheezing, shortness of breath, ear pain, or sore throat. Patient has been self-treating with Mucinex, DayQuil/NyQuil, and Afrin nasal spray. -Prescribe Augmentin for one week. -Continue Mucinex, Tylenol, and ibuprofen as needed for symptom relief. -Recommend warm compresses, sleeping with a humidifier, and Flonase  -Advise patient to contact the office if symptoms do not improve within a few days of starting antibiotics or if condition worsens.         Meds ordered this encounter  Medications   amoxicillin-clavulanate (AUGMENTIN) 875-125 MG tablet    Sig: Take 1 tablet by mouth 2 (two) times daily for 7 days.    Dispense:  14 tablet    Refill:  0    Supervising Provider:   Danise Edge A [[id number]]    Return if symptoms worsen or fail to improve.  Clayborne Dana, NP

## 2023-10-22 NOTE — Patient Instructions (Signed)
   Over the counter medications that may be helpful for symptoms:  Guaifenesin 1200 mg extended release tabs twice daily, with plenty of water For cough and congestion Brand name: Mucinex   Pseudoephedrine 30 mg, one or two tabs every 4 to 6 hours For sinus congestion Brand name: Sudafed You must get this from the pharmacy counter.  Oxymetazoline nasal spray each morning, one spray in each nostril, for NO MORE THAN 3 days  For nasal and sinus congestion Brand name: Afrin Saline nasal spray or Saline Nasal Irrigation (Netti Pot, etc) 3-5 times a day For nasal and sinus congestion Brand names: Ocean or AYR Fluticasone nasal spray OR Mometasone nasal spray OR Triamcinolone Acetonide nasal spray - follow directions on the packaging For nasal and sinus congestion Brand name: Flonase, Nasonex, Nasacort Warm salt water gargles  For sore throat Every few hours as needed Alternate ibuprofen 400-600 mg and acetaminophen 1000 mg every 6 hours For fever, body aches, headache Brand names: Motrin or Advil and Tylenol Dextromethorphan 12-hour cough version 30 mg every 12 hours  For cough Brand name: Delsym Stop all other cold medications for now (Nyquil, Dayquil, Tylenol Cold, Theraflu, etc) and other non-prescription cough/cold preparations. Many of these have the same ingredients listed above and could cause an overdose of medication.   Herbal treatments that have been shown to be helpful in some patients include: Vitamin C 1000 mg per day Zinc 100 mg per day Quercetin 25-500 mg twice a day Melatonin 5-10mg  at bedtime Honey Green Tea  General Instructions Allow your body to rest Drink PLENTY of fluids Typically, we are the most contagious 1-2 days before symptoms start through the first 2-3 days of most severe symptoms. Per CDC guidelines, you can return to school/work when symptoms have started to improve and you have been fever-free for 24 hours. However, recommend you continue extra  precautions for the following 5 days (frequent hand hygiene, masking, covering coughs/sneezes, minimize exposure to immunocompromised individuals, etc).  If you develop severe shortness of breath, uncontrolled fevers, coughing up blood, confusion, chest pain, or signs of dehydration (such as significantly decreased urine amounts or dizziness with standing) please go to the nearest ER.

## 2024-02-10 ENCOUNTER — Ambulatory Visit: Payer: Self-pay

## 2024-02-10 ENCOUNTER — Telehealth: Payer: Self-pay

## 2024-02-10 NOTE — Telephone Encounter (Signed)
 2nd attempt to contact pt: called contact number listed on chart and states call can not be completed as dialed.

## 2024-02-10 NOTE — Telephone Encounter (Signed)
 Message from Wallace J sent at 02/10/2024  5:21 PM EDT  Summary: Slurred speech, forgetforness for 2 weeks.   Pt's spouse Stephenie Einstein called in on patients behalf to schedule an appt. Spouse says that pt has been experiencing Slurred speech and forgetting things for the last 2 weeks.   Previous call an agent scheduled pt an appt with PCP. Office advised triaging. Please call pt back at spouse number [phone]   Chief Complaint: Neurologic symptoms  Symptoms: Tripping over words, some memory problems  Frequency: Intermittent  Pertinent Negatives: Patient denies dizziness, vision changes, headaches  Disposition: [] ED /[] Urgent Care (no appt availability in office) / [x] Appointment(In office/virtual)/ []  [facility]/ [] Home Care/ [] Refused Recommended Disposition /[]  Mobile Bus/ []  Follow-up with PCP Additional Notes: Patient returning call about his neurologic symptoms. Patient states that his speech has not been slurred, but that when talking he will sometimes get anxious and trip over his words. He states that the memory concerns are also mild, stating that "I'll be driving and forget what road I'm on for a minute." He states that he has been experiencing these symptoms intermittently over the last 6 months. Patient will keep his current appointment and has been instructed to call back for any new or worsening symptoms. Patient verbalized understanding and agreement of this plan.     Reason for Disposition  [1] Loss of speech or garbled speech AND [2] is a chronic symptom (recurrent or ongoing AND present > 4 weeks)  Answer Assessment - Initial Assessment Questions 1. SYMPTOM: "What is the main symptom you are concerned about?" (e.g., weakness, numbness)     Slurred speech, memory issues  2. ONSET: "When did this start?" (minutes, hours, days; while sleeping)     6 months  3. LAST NORMAL: "When was the last time you (the patient) were normal (no symptoms)?"     No  current symptoms  4. PATTERN "Does this come and go, or has it been constant since it started?"  "Is it present now?"     Intermittent  5. CARDIAC SYMPTOMS: "Have you had any of the following symptoms: chest pain, difficulty breathing, palpitations?"     No 6. NEUROLOGIC SYMPTOMS: "Have you had any of the following symptoms: headache, dizziness, vision loss, double vision, changes in speech, unsteady on your feet?"     No 7. OTHER SYMPTOMS: "Do you have any other symptoms?"     No  Protocols used: Neurologic Deficit-A-AH

## 2024-02-10 NOTE — Telephone Encounter (Signed)
 1st attempt, voicemail left to return call.     Message from [place] J sent at 02/10/2024  5:21 PM EDT  Summary: Slurred speech, forgetforness for 2 weeks.   Pt's spouse Stephenie Einstein called in on patients behalf to schedule an appt. Spouse says that pt has been experiencing Slurred speech and forgetting things for the last 2 weeks.   Previous call an agent scheduled pt an appt with PCP. Office advised triaging. Please call pt back at spouse number [phone]

## 2024-02-10 NOTE — Telephone Encounter (Signed)
 3rd attempt: called phone number listed in message [phone]: lade answered and stated I have the wrong phone number. Will route chart to PCP office .

## 2024-02-10 NOTE — Telephone Encounter (Signed)
 Called New number provided [phone], no answer left message to call back.  Routing for no contact X 3

## 2024-02-10 NOTE — Telephone Encounter (Signed)
 Tried to reach him again tomorrow. If he truly has slurred speech of recent onset or if he is confused: Needs to go to the ER

## 2024-02-10 NOTE — Telephone Encounter (Addendum)
 Pt's spouse Stephenie Einstein called back in after receiving my voicemail. Spouse says that the number on file is not a current working number for the patient, she will update it once patient receives a new phone.   Spouse stated that patient has been dealing with these concerns for the last 2 weeks. Spouse stated that the patient is currently at work but she will have him to call in to speak with NT for his visit concerns once he get in this evening.   I also placed a clinical call so that NT would be sure to reach out to the patient for triaging.

## 2024-02-10 NOTE — Telephone Encounter (Signed)
 Pt scheduled for 4/21 at 0820 w/ PCP for "slurred speech and cognitive issues", he was not triaged. I tried calling Pt- phone line rings busy x2. Unable to reach. I'll try sending him a mychart message as well .

## 2024-02-11 NOTE — Telephone Encounter (Signed)
 Noted, thank you

## 2024-02-11 NOTE — Telephone Encounter (Signed)
 FYI. Pt will keep appt for Monday.

## 2024-02-15 ENCOUNTER — Encounter: Payer: Self-pay | Admitting: Internal Medicine

## 2024-02-15 ENCOUNTER — Ambulatory Visit (INDEPENDENT_AMBULATORY_CARE_PROVIDER_SITE_OTHER): Admitting: Internal Medicine

## 2024-02-15 VITALS — BP 138/70 | HR 71 | Temp 98.3°F | Resp 16 | Ht 65.5 in | Wt 176.0 lb

## 2024-02-15 DIAGNOSIS — R739 Hyperglycemia, unspecified: Secondary | ICD-10-CM

## 2024-02-15 DIAGNOSIS — G3184 Mild cognitive impairment, so stated: Secondary | ICD-10-CM

## 2024-02-15 DIAGNOSIS — R634 Abnormal weight loss: Secondary | ICD-10-CM | POA: Diagnosis not present

## 2024-02-15 DIAGNOSIS — Z1211 Encounter for screening for malignant neoplasm of colon: Secondary | ICD-10-CM

## 2024-02-15 LAB — CBC WITH DIFFERENTIAL/PLATELET
Basophils Absolute: 0 10*3/uL (ref 0.0–0.1)
Basophils Relative: 0.4 % (ref 0.0–3.0)
Eosinophils Absolute: 0.1 10*3/uL (ref 0.0–0.7)
Eosinophils Relative: 1.8 % (ref 0.0–5.0)
HCT: 43.6 % (ref 39.0–52.0)
Hemoglobin: 14.5 g/dL (ref 13.0–17.0)
Lymphocytes Relative: 11.4 % — ABNORMAL LOW (ref 12.0–46.0)
Lymphs Abs: 0.9 10*3/uL (ref 0.7–4.0)
MCHC: 33.3 g/dL (ref 30.0–36.0)
MCV: 91.2 fl (ref 78.0–100.0)
Monocytes Absolute: 0.4 10*3/uL (ref 0.1–1.0)
Monocytes Relative: 5.6 % (ref 3.0–12.0)
Neutro Abs: 6.5 10*3/uL (ref 1.4–7.7)
Neutrophils Relative %: 80.8 % — ABNORMAL HIGH (ref 43.0–77.0)
Platelets: 194 10*3/uL (ref 150.0–400.0)
RBC: 4.78 Mil/uL (ref 4.22–5.81)
RDW: 14 % (ref 11.5–15.5)
WBC: 8 10*3/uL (ref 4.0–10.5)

## 2024-02-15 LAB — COMPREHENSIVE METABOLIC PANEL WITH GFR
ALT: 28 U/L (ref 0–53)
AST: 26 U/L (ref 0–37)
Albumin: 4.3 g/dL (ref 3.5–5.2)
Alkaline Phosphatase: 54 U/L (ref 39–117)
BUN: 17 mg/dL (ref 6–23)
CO2: 27 meq/L (ref 19–32)
Calcium: 9.4 mg/dL (ref 8.4–10.5)
Chloride: 104 meq/L (ref 96–112)
Creatinine, Ser: 0.96 mg/dL (ref 0.40–1.50)
GFR: 83.67 mL/min (ref >60.00)
Glucose, Bld: 125 mg/dL — ABNORMAL HIGH (ref 70–99)
Potassium: 4.1 meq/L (ref 3.5–5.1)
Sodium: 140 meq/L (ref 135–145)
Total Bilirubin: 0.3 mg/dL (ref 0.2–1.2)
Total Protein: 6.5 g/dL (ref 6.0–8.3)

## 2024-02-15 LAB — T4, FREE: Free T4: 0.67 ng/dL (ref 0.60–1.60)

## 2024-02-15 LAB — HEMOGLOBIN A1C: Hgb A1c MFr Bld: 6.1 % (ref 4.6–6.5)

## 2024-02-15 LAB — B12 AND FOLATE PANEL
Folate: 10.7 ng/mL (ref >5.9)
Vitamin B-12: 337 pg/mL (ref 211–911)

## 2024-02-15 LAB — SEDIMENTATION RATE: Sed Rate: 9 mm/h (ref 0–20)

## 2024-02-15 LAB — T3, FREE: T3, Free: 3.4 pg/mL (ref 2.3–4.2)

## 2024-02-15 LAB — TSH: TSH: 1.16 u[IU]/mL (ref 0.35–5.50)

## 2024-02-15 NOTE — Assessment & Plan Note (Signed)
 MCI ?: Patient concerned about MCI as described above, has also difficult time finding words, very forgetful. Plan: Labs including a sed rate, refer to neurology. Weight loss: Unexplained, no fever or chills but occasional night sweats.  Plan: TFTs, CBC, further advised for results Hyperglycemia: Check A1c Anxiety, depression: Depression is controlled, he has been more anxious lately, unclear triggers, follow-up by psychiatry. Preventive care: Request a GI referral, due for colonoscopy RTC 3 months for physical exam

## 2024-02-15 NOTE — Patient Instructions (Signed)
 INSTRUCTIONS  FOR TODAY  Referrals: Neurology for evaluation of your memory Gastroenterology for a colonoscopy.  Please call them at [phone]  GO TO THE LAB : Get the blood work     Next office visit for a physical exam in 3 months Please make an appointment before you leave today Depending on your blood or XRs results it might be necessary to come back sooner

## 2024-02-15 NOTE — Progress Notes (Signed)
 Subjective:    Patient ID: Colton Mcconnell, male    DOB: Dec 15, 1960, 63 y.o.   MRN: [id number]  DOS:  02/15/2024 Type of visit - description: Acute  MCI? For the last years getting gradually more forgetful, poor short-term memory, has frequently gotten lost driving back home. Denies depression. Admits to anxiety without obvious triggers for the last 6 months, to the point that he "shakes" in the mornings.  I ask about weight loss and he admits to unintentional weight loss His appetite has been decreased. Denies postprandial nausea, no vomiting, no diarrhea, no blood in the stools. No LUTS. No cough. Admits to occasional night sweats.  He denies major headaches but admits to occasional dizziness mostly when he moves his head. No motor or deficits, no diplopia .  Wt Readings from Last 3 Encounters:  02/15/24 176 lb (79.8 kg)  10/22/23 186 lb (84.4 kg)  06/10/23 183 lb (83 kg)    Review of Systems See above   Past Medical History:  Diagnosis Date   Anxiety    Arthritis    neck, lower back   Back pain    sporadic   Disc degeneration, lumbosacral    at 4-5, 5-1   Erectile dysfunction    Facet arthritis of lumbosacral region    at 4-5 and neural foraminal stenosis   Maxillary sinusitis    Recurrent   Mouth lesion    Spinal stenosis    laminotomy defect at 4-5, 5-1, pars defect at 4    Past Surgical History:  Procedure Laterality Date   BACK SURGERY     cysts removed from spine age 40 and 71   BACK SURGERY  2022   L3-L4    Current Outpatient Medications  Medication Instructions   FLUoxetine  (PROZAC ) 60 mg, Oral, Daily   HYDROcodone -acetaminophen  (NORCO) 10-325 MG tablet 1 tablet, Every 8 hours PRN   lamoTRIgine  (LAMICTAL ) 200 mg, Daily   Multiple Vitamins-Minerals (MULTIVITAMIN WITH MINERALS) tablet 1 tablet, Every morning   sildenafil  (REVATIO ) 60-80 mg, Oral, At bedtime PRN       Objective:   Physical Exam BP 138/70   Pulse 71   Temp 98.3 F (36.8  C) (Oral)   Resp 16   Ht 5' 5.5" (1.664 m)   Wt 176 lb (79.8 kg)   SpO2 97%   BMI 28.84 kg/m  General: Well developed, NAD, BMI noted Neck: No  thyromegaly  HEENT:  Normocephalic . Face symmetric, atraumatic Lungs:  CTA B Normal respiratory effort, no intercostal retractions, no accessory muscle use. Heart: RRR,  no murmur.  Abdomen:  Not distended, soft, non-tender. No rebound or rigidity.   Lower extremities: no pretibial edema bilaterally  Skin: Exposed areas without rash. Not pale. Not jaundice Neurologic:  alert & oriented to self, place and time .  Speech normal, gait appropriate for age and unassisted Strength symmetric and appropriate for age. EOMI. Psych: Cognition and judgment appear intact.  Cooperative with normal attention span and concentration.  Behavior appropriate. No anxious or depressed appearing.     Assessment     Assessment Prediabetes A1c 5.9 (06-2013) Anxiety per psych Use to see Dr. Cherryl Corona  Back pain, saw Ortho 2016, had an MRI, dx spinal stenosis, s/p surgery 11-2016  GU: --ED  --H/o low testost, dx per urology, declined HRT or more testing --H/o Peyronie's Dz  DX@urology  Sees derm regularly , had few lesions removed  Amblyopia +FH CAD Cabg age 1  PLAN MCI ?: Patient concerned  about MCI as described above, has also difficult time finding words, very forgetful. Plan: Labs including a sed rate, refer to neurology. Weight loss: Unexplained, no fever or chills but occasional night sweats.  Plan: TFTs, CBC, further advised for results Hyperglycemia: Check A1c Anxiety, depression: Depression is controlled, he has been more anxious lately, unclear triggers, follow-up by psychiatry. Preventive care: Request a GI referral, due for colonoscopy RTC 3 months for physical exam

## 2024-02-17 ENCOUNTER — Encounter: Payer: Self-pay | Admitting: Internal Medicine

## 2024-03-10 ENCOUNTER — Encounter: Payer: Self-pay | Admitting: Gastroenterology

## 2024-04-21 NOTE — Progress Notes (Signed)
 LOOMIS ANACKER [id number] 24-Sep-1961   Chief Complaint: Discuss colonoscopy  Referring Provider: Amon Aloysius BRAVO, MD Primary GI MD: [facility] (previous Dr. Teressa)  HPI: Colton Mcconnell is a 63 y.o. male with past medical history of adenomatous colon polyps who presents today to discuss colonoscopy.    02/15/2024 patient seen by PCP for concern of memory changes and unintentional weight loss with decreased appetite.  Referred to neurology for memory changes and referred to GI for colonoscopy and to discuss weight loss.  Labs 02/15/2024: Normal CBC, CMP, hemoglobin A1c, TSH, vitamin B12, folate, ESR   Patient states that his weight had been about 187 lbs and got down to 172; however, he did make significant dietary changes including eliminating sodas, eliminating junk food, and reducing alcohol intake.  He states he has actually put on a few pounds, weight today is 178 lbs.  He denies any GI concerns at this time.  Reports regular bowel movements without constipation or diarrhea.  Denies blood in the stool or melena.  He denies abdominal pain, nausea, vomiting, heartburn, dysphagia.  Has rare acid reflux. Occasionally when he drinks beer he can have trouble with bloating which is relieved by belching.    He denies any family history of colon or stomach cancer.  Denies blood thinner use.  No history of MI/stroke.    Previous GI Procedures/Imaging   Colonoscopy 09/28/2015 Sessile polyp found in ascending colon, polypectomy was performed with cold forceps Melanosis coli was found throughout the entire examined colon (completely benign staining of the colon from laxatives) The examination was otherwise normal Recall 5 years Path: Surgical [P], ascending, polyp - TUBULAR ADENOMA (X1); NEGATIVE FOR HIGH GRADE DYSPLASIA OR MALIGNANCY.  Past Medical History:  Diagnosis Date   Anxiety    Arthritis    neck, lower back   Back pain    sporadic   Disc degeneration, lumbosacral    at 4-5,  5-1   Erectile dysfunction    Facet arthritis of lumbosacral region    at 4-5 and neural foraminal stenosis   Maxillary sinusitis    Recurrent   Mouth lesion    Spinal stenosis    laminotomy defect at 4-5, 5-1, pars defect at 4    Past Surgical History:  Procedure Laterality Date   BACK SURGERY     cysts removed from spine age 53 and 49   BACK SURGERY  2022   L3-L4    Current Outpatient Medications  Medication Sig Dispense Refill   FLUoxetine  (PROZAC ) 20 MG tablet Take 3 tablets (60 mg total) by mouth daily. 90 tablet 0   HYDROcodone -acetaminophen  (NORCO) 10-325 MG tablet Take 1 tablet by mouth every 8 (eight) hours as needed.     lamoTRIgine  (LAMICTAL ) 200 MG tablet Take 200 mg by mouth daily.     Multiple Vitamins-Minerals (MULTIVITAMIN WITH MINERALS) tablet Take 1 tablet by mouth every morning.     sildenafil  (REVATIO ) 20 MG tablet Take 3-4 tablets (60-80 mg total) by mouth at bedtime as needed. 30 tablet 3   No current facility-administered medications for this visit.    Allergies as of 04/22/2024   (No Known Allergies)    Family History  Problem Relation Age of Onset   Breast cancer Mother    CAD Father        cabg at age 10, currently 17   Hypertension Father    Diabetes Father    Parkinson's disease Father    Dementia Father  Alcohol abuse Other        many family members    Colon cancer Neg Hx    Prostate cancer Neg Hx    Colon polyps Neg Hx    Rectal cancer Neg Hx    Stomach cancer Neg Hx    Esophageal cancer Neg Hx     Social History   Tobacco Use   Smoking status: Never    Passive exposure: Never   Smokeless tobacco: Never  Vaping Use   Vaping status: Never Used  Substance Use Topics   Alcohol use: No    Alcohol/week: 10.0 standard drinks of alcohol    Types: 10 Cans of beer per week    Comment:  drinks twice a week   Drug use: No     Review of Systems:    Constitutional: No unexplained weight loss, fever, chills, weakness or  fatigue Skin: No rash or itching Cardiovascular: No chest pain, chest pressure or palpitations   Respiratory: No SOB or cough Gastrointestinal: See HPI and otherwise negative Genitourinary: No dysuria or change in urinary frequency Neurological: No headache, dizziness or syncope Musculoskeletal: No new muscle or joint pain Hematologic: No bleeding or bruising    Physical Exam:  Vital signs: BP (!) 102/56   Pulse 74   Ht 5' 6 (1.676 m)   Wt 178 lb (80.7 kg)   BMI 28.73 kg/m    Wt Readings from Last 3 Encounters:  04/22/24 178 lb (80.7 kg)  02/15/24 176 lb (79.8 kg)  10/22/23 186 lb (84.4 kg)    Constitutional: NAD, Well developed, Well nourished, alert and cooperative Head:  Normocephalic and atraumatic.  Eyes: No scleral icterus. Conjunctiva pink. Mouth: No oral lesions. Respiratory: Respirations even and unlabored. Lungs clear to auscultation bilaterally.  No wheezes, crackles, or rhonchi.  Cardiovascular:  Regular rate and rhythm. No murmurs. No peripheral edema. Gastrointestinal:  Soft, nondistended, nontender. No rebound or guarding. Normal bowel sounds. No appreciable masses or hepatomegaly. Rectal:  Not performed.  Neurologic:  Alert and oriented x4;  grossly normal neurologically.  Skin:   Dry and intact without significant lesions or rashes. Psychiatric: Oriented to person, place and time. Demonstrates good judgement and reason without abnormal affect or behaviors.   RELEVANT LABS AND IMAGING: CBC    Component Value Date/Time   WBC 8.0 02/15/2024 0833   RBC 4.78 02/15/2024 0833   HGB 14.5 02/15/2024 0833   HCT 43.6 02/15/2024 0833   PLT 194.0 02/15/2024 0833   MCV 91.2 02/15/2024 0833   MCH 31.1 01/16/2023 1448   MCHC 33.3 02/15/2024 0833   RDW 14.0 02/15/2024 0833   LYMPHSABS 0.9 02/15/2024 0833   MONOABS 0.4 02/15/2024 0833   EOSABS 0.1 02/15/2024 0833   BASOSABS 0.0 02/15/2024 0833    CMP     Component Value Date/Time   NA 140 02/15/2024 0833    K 4.1 02/15/2024 0833   CL 104 02/15/2024 0833   CO2 27 02/15/2024 0833   GLUCOSE 125 (H) 02/15/2024 0833   BUN 17 02/15/2024 0833   CREATININE 0.96 02/15/2024 0833   CREATININE 0.94 01/16/2023 1448   CALCIUM 9.4 02/15/2024 0833   PROT 6.5 02/15/2024 0833   ALBUMIN 4.3 02/15/2024 0833   AST 26 02/15/2024 0833   ALT 28 02/15/2024 0833   ALKPHOS 54 02/15/2024 0833   BILITOT 0.3 02/15/2024 0833     Assessment/Plan:   History of adenomatous colon polyp Last colonoscopy 09/2015 with 1 tubular adenoma found.  5 year  recall was recommended. Patient denies any GI symptoms at this time.  - Schedule colonoscopy. I thoroughly discussed the procedure with the patient to include nature of the procedure, alternatives, benefits, and risks (including but not limited to bleeding, infection, perforation, anesthesia/cardiac/pulmonary complications). Patient verbalized understanding and gave verbal consent to proceed with procedure.   Weight loss Recent weight loss most likely explained by patient's recent significant dietary changes including eliminating soda, eliminating junk food, and reducing alcohol intake.  He has actually gained some weight back recently.   Wt Readings from Last 3 Encounters:  04/22/24 178 lb (80.7 kg)  02/15/24 176 lb (79.8 kg)  10/22/23 186 lb (84.4 kg)    - Continue to monitor. If unexplained weight loss consider CT chest/abdomen/pelvis.   Camie Furbish, PA-C [facility] 04/21/2024, 8:29 AM  Patient Care Team: Amon Aloysius BRAVO, MD as PCP - General (Internal Medicine) Teressa Toribio SQUIBB, MD (Inactive) as Attending Physician (Gastroenterology) Karis Clunes, MD as Consulting Physician (Otolaryngology)

## 2024-04-22 ENCOUNTER — Encounter: Payer: Self-pay | Admitting: Gastroenterology

## 2024-04-22 ENCOUNTER — Ambulatory Visit (INDEPENDENT_AMBULATORY_CARE_PROVIDER_SITE_OTHER): Admitting: Gastroenterology

## 2024-04-22 VITALS — BP 102/56 | HR 74 | Ht 66.0 in | Wt 178.0 lb

## 2024-04-22 DIAGNOSIS — R634 Abnormal weight loss: Secondary | ICD-10-CM

## 2024-04-22 DIAGNOSIS — Z860101 Personal history of adenomatous and serrated colon polyps: Secondary | ICD-10-CM | POA: Diagnosis not present

## 2024-04-22 MED ORDER — NA SULFATE-K SULFATE-MG SULF 17.5-3.13-1.6 GM/177ML PO SOLN: 1.0000 | Freq: Once | ORAL | 0 refills | Status: AC

## 2024-04-22 NOTE — Patient Instructions (Signed)
 You have been scheduled for a colonoscopy. Please follow written instructions given to you at your visit today.   If you use inhalers (even only as needed), please bring them with you on the day of your procedure.  DO NOT TAKE 7 DAYS PRIOR TO TEST- Trulicity (dulaglutide) Ozempic, Wegovy (semaglutide) Mounjaro (tirzepatide) Bydureon Bcise (exanatide extended release)  DO NOT TAKE 1 DAY PRIOR TO YOUR TEST Rybelsus (semaglutide) Adlyxin (lixisenatide) Victoza (liraglutide) Byetta (exanatide) ______________________________________________________________________  If your blood pressure at your visit was 140/90 or greater, please contact your primary care physician to follow up on this.  _______________________________________________________  If you are age 59 or older, your body mass index should be between 23-30. Your Body mass index is 28.73 kg/m. If this is out of the aforementioned range listed, please consider follow up with your Primary Care Provider.  If you are age 88 or younger, your body mass index should be between 19-25. Your Body mass index is 28.73 kg/m. If this is out of the aformentioned range listed, please consider follow up with your Primary Care Provider.   ________________________________________________________  The [facility] providers would like to encourage you to use MYCHART to communicate with providers for non-urgent requests or questions.  Due to long hold times on the telephone, sending your provider a message by [facility] may be a faster and more efficient way to get a response.  Please allow 48 business hours for a response.  Please remember that this is for non-urgent requests.  _______________________________________________________

## 2024-05-03 ENCOUNTER — Encounter: Admitting: Internal Medicine

## 2024-05-04 NOTE — Progress Notes (Signed)
 ____________________________________________________________  Attending physician addendum:  Thank you for sending this case to me. I have reviewed the entire note and agree with the plan.   Amada Jupiter, MD  ____________________________________________________________

## 2024-05-11 ENCOUNTER — Ambulatory Visit (INDEPENDENT_AMBULATORY_CARE_PROVIDER_SITE_OTHER): Payer: Self-pay | Admitting: Neurology

## 2024-05-11 ENCOUNTER — Encounter: Payer: Self-pay | Admitting: Neurology

## 2024-05-11 VITALS — BP 128/68 | Ht 66.0 in | Wt 174.0 lb

## 2024-05-11 DIAGNOSIS — H518 Other specified disorders of binocular movement: Secondary | ICD-10-CM | POA: Diagnosis not present

## 2024-05-11 DIAGNOSIS — E538 Deficiency of other specified B group vitamins: Secondary | ICD-10-CM | POA: Diagnosis not present

## 2024-05-11 DIAGNOSIS — R4189 Other symptoms and signs involving cognitive functions and awareness: Secondary | ICD-10-CM

## 2024-05-11 DIAGNOSIS — H5581 Saccadic eye movements: Secondary | ICD-10-CM | POA: Diagnosis not present

## 2024-05-11 MED ORDER — VITAMIN B-12 1000 MCG PO TABS: 1000.0000 ug | ORAL_TABLET | Freq: Every day | ORAL | 3 refills | Status: AC

## 2024-05-11 NOTE — Patient Instructions (Addendum)
 Due to abnormal saccade, will obtain MRI brain and MRI orbit with and without contrast Start B12 supplement daily Referral for formal neuropsychological testing for MCI vs. ADHD  Continue to follow with PCP Return 1 year or sooner if worse.  There are well-accepted and sensible ways to reduce risk for Alzheimers disease and other degenerative brain disorders .  Exercise Daily Walk A daily 20 minute walk should be part of your routine. Disease related apathy can be a significant roadblock to exercise and the only way to overcome this is to make it a daily routine and perhaps have a reward at the end (something your loved one loves to eat or drink perhaps) or a personal trainer coming to the home can also be very useful. Most importantly, the patient is much more likely to exercise if the caregiver / spouse does it with him/her. In general a structured, repetitive schedule is best.  General Health: Any diseases which effect your body will effect your brain such as a pneumonia, urinary infection, blood clot, heart attack or stroke. Keep contact with your primary care doctor for regular follow ups.  Sleep. A good nights sleep is healthy for the brain. Seven hours is recommended. If you have insomnia or poor sleep habits we can give you some instructions. If you have sleep apnea wear your mask.  Diet: Eating a heart healthy diet is also a good idea; fish and poultry instead of red meat, nuts (mostly non-peanuts), vegetables, fruits, olive oil or canola oil (instead of butter), minimal salt (use other spices to flavor foods), whole grain rice, bread, cereal and pasta and wine in moderation.Research is now showing that the MIND diet, which is a combination of The Mediterranean diet and the DASH diet, is beneficial for cognitive processing and longevity. Information about this diet can be found in The MIND Diet, a book by Annitta Feeling, MS, RDN, and online at  [employer].es  Finances, Power of [address] and Advance Directives: You should consider putting legal safeguards in place with regard to financial and medical decision making. While the spouse always has power of attorney for medical and financial issues in the absence of any form, you should consider what you want in case the spouse / caregiver is no longer around or capable of making decisions.

## 2024-05-11 NOTE — Progress Notes (Signed)
 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  The patient is a 63 year old gentleman with history of ?bipolar disorder, anxiety, depression who presents with memory issues and cognitive concerns.  He has experienced a decline in cognitive function over the past five years, characterized by forgetfulness and difficulty recalling recent instructions or conversations. He often forgets tasks at work, such as which machine to work on, and experiences moments of disorientation while driving, questioning if he is on the correct route. 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  He describes episodes where he struggles with word-finding during presentations, stating that words 'just not good' by the time they reach his mouth. He does not have issues with daily conversations or remembering recent discussions with his wife, but sometimes forgets plans made weeks in advance.  No issues with daily activities such as cooking, cleaning, or managing finances. He has not experienced any accidents while driving and does not forget to pay bills, although he may delay payment due to procrastination rather than forgetfulness.  He has a history of a head injury from a fall 14 years ago, with loss of consciousness. No history of stroke, traumatic brain injury, seizures, or sleep apnea. He has been on Lamictal  and Prozac  for stress and anxiety for  25 years, although he is unsure of their current effectiveness. He was evaluated for ADHD as a child but was never diagnosed.  Family history is significant for dementia, with his paternal grandfather, and father and brother (history of drug abuse) having been affected. His brother also has cognitive issues, but these are attributed to drug use.       TBI:  Concusion  Stroke:   no past history of stroke Seizures:  no past history of seizures Sleep:  no history of sleep apnea.   Mood: Stress and Anxiety  Family history of Dementia: Paternal Grandfather, Father, Brother Denies  Functional status: independent in all ** ADLs and IADLs Patient lives with spouse. Cooking: no issues  Cleaning: no issues  Shopping: no issues  Bathing: no issues  Toileting: no issues  Driving: no issues  Bills: no issues  Medications: no issues  Ever left the stove on by accident?: denies Forget how to use items around the house?: denies Getting lost going to familiar places?: denies  Forgetting loved ones names?: denies  Word finding difficulty? yes Sleep: good    OTHER MEDICAL CONDITIONS: Anxiety/Depression, ?Bipolar disorder    REVIEW OF SYSTEMS: Full 14 system review of systems performed and negative with exception of: As noted in the HPI   ALLERGIES: No Known Allergies  HOME MEDICATIONS: Outpatient Medications Prior to Visit  Medication Sig Dispense Refill   FLUoxetine  (PROZAC ) 20 MG tablet Take 3 tablets (60 mg total) by mouth daily. 90 tablet 0   HYDROcodone -acetaminophen  (NORCO) 10-325 MG tablet Take 1 tablet by mouth every 8 (eight) hours as needed.  lamoTRIgine  (LAMICTAL ) 200 MG tablet Take 200 mg by mouth daily.     Multiple Vitamins-Minerals (MULTIVITAMIN WITH MINERALS) tablet Take 1 tablet by mouth every morning.     sildenafil  (REVATIO ) 20 MG tablet Take 3-4 tablets (60-80 mg total) by mouth at bedtime as needed. 30 tablet 3   No facility-administered medications prior to  visit.    PAST MEDICAL HISTORY: Past Medical History:  Diagnosis Date   Anxiety    Arthritis    neck, lower back   Back pain    sporadic   Disc degeneration, lumbosacral    at 4-5, 5-1   Erectile dysfunction    Facet arthritis of lumbosacral region    at 4-5 and neural foraminal stenosis   Maxillary sinusitis    Recurrent   Mouth lesion    Spinal stenosis    laminotomy defect at 4-5, 5-1, pars defect at 4    PAST SURGICAL HISTORY: Past Surgical History:  Procedure Laterality Date   BACK SURGERY     cysts removed from spine age 62 and 1   BACK SURGERY  2022   L3-L4    FAMILY HISTORY: Family History  Problem Relation Age of Onset   Breast cancer Mother    CAD Father        cabg at age 77, currently 42   Hypertension Father    Diabetes Father    Parkinson's disease Father    Dementia Father    Alcohol abuse Other        many family members    Colon cancer Neg Hx    Prostate cancer Neg Hx    Colon polyps Neg Hx    Rectal cancer Neg Hx    Stomach cancer Neg Hx    Esophageal cancer Neg Hx     SOCIAL HISTORY: Social History   Socioeconomic History   Marital status: Married    Spouse name: Not on file   Number of children: 2   Years of education: Not on file   Highest education level: Not on file  Occupational History   Occupation: Works at [employer] - nights    Comment:    Tobacco Use   Smoking status: Never    Passive exposure: Never   Smokeless tobacco: Never  Vaping Use   Vaping status: Never Used  Substance and Sexual Activity   Alcohol use: No    Alcohol/week: 10.0 standard drinks of alcohol    Types: 10 Cans of beer per week    Comment:  drinks twice a week   Drug use: No   Sexual activity: Yes  Other Topics Concern   Not on file  Social History Narrative   Household: Pt and wife   2 boys- adults   2nd wife has 2 daughters, independent    Right handed   Caffeine-1 daily   Social Drivers of Health   Financial Resource Strain: Low Risk   (04/22/2024)   Received from [facility]   Overall Financial Resource Strain (CARDIA)    Difficulty of Paying Living Expenses: Not hard at all  Food Insecurity: No Food Insecurity (04/22/2024)   Received from [facility]   Hunger Vital Sign    Within the past 12 months, you worried that your food would run out before you got the money to buy more.: Never true    Within the past 12 months, the food you bought just didn't last and you didn't have money to get more.: Never true  Transportation  Needs: No Transportation Needs (04/22/2024)   Received from [facility]   PRAPARE - Transportation    Lack of Transportation (Medical): No    Lack of Transportation (Non-Medical): No  Physical Activity: Not on file  Stress: Not on file  Social Connections: Not on file  Intimate Partner Violence: Not on file     PHYSICAL EXAM  GENERAL EXAM/CONSTITUTIONAL: Vitals:  Vitals:   05/11/24 1514  BP: 128/68  Weight: 174 lb (78.9 kg)  Height: 5' 6 (1.676 m)   Body mass index is 28.08 kg/m. Wt Readings from Last 3 Encounters:  05/11/24 174 lb (78.9 kg)  04/22/24 178 lb (80.7 kg)  02/15/24 176 lb (79.8 kg)   Patient is in no distress; well developed, nourished and groomed; neck is supple  MUSCULOSKELETAL: Gait, strength, tone, movements noted in Neurologic exam below  NEUROLOGIC: MENTAL STATUS:     05/11/2024    3:21 PM  MMSE - Mini Mental State Exam  Orientation to time 5  Orientation to Place 5  Registration 3  Attention/ Calculation 5  Recall 3  Language- name 2 objects 2  Language- repeat 0  Language- follow 3 step command 3  Language- read & follow direction 1  Write a sentence 1  Copy design 1  Total score 29    awake, alert, oriented to person, place and time recent and remote memory intact normal attention and concentration language fluent, comprehension intact, naming intact fund of knowledge appropriate  CRANIAL NERVE:  2nd, 3rd, 4th, 6th- visual fields full  to confrontation, Unable to fully abduct the right eye on right lateral gaze and the left eye on left lateral gaze, no nystagmus 5th - facial sensation symmetric 7th - facial strength symmetric 8th - hearing intact 9th - palate elevates symmetrically, uvula midline 11th - shoulder shrug symmetric 12th - tongue protrusion midline  MOTOR:  normal bulk and tone, full strength in the BUE, BLE  SENSORY:  normal and symmetric to light touch  COORDINATION:  finger-nose-finger, fine finger movements normal  GAIT/STATION:  normal   DIAGNOSTIC DATA (LABS, IMAGING, TESTING) - I reviewed patient records, labs, notes, testing and imaging myself where available.  Lab Results  Component Value Date   WBC 8.0 02/15/2024   HGB 14.5 02/15/2024   HCT 43.6 02/15/2024   MCV 91.2 02/15/2024   PLT 194.0 02/15/2024      Component Value Date/Time   NA 140 02/15/2024 0833   K 4.1 02/15/2024 0833   CL 104 02/15/2024 0833   CO2 27 02/15/2024 0833   GLUCOSE 125 (H) 02/15/2024 0833   BUN 17 02/15/2024 0833   CREATININE 0.96 02/15/2024 0833   CREATININE 0.94 01/16/2023 1448   CALCIUM 9.4 02/15/2024 0833   PROT 6.5 02/15/2024 0833   ALBUMIN 4.3 02/15/2024 0833   AST 26 02/15/2024 0833   ALT 28 02/15/2024 0833   ALKPHOS 54 02/15/2024 0833   BILITOT 0.3 02/15/2024 0833   Lab Results  Component Value Date   CHOL 162 04/03/2023   HDL 71 04/03/2023   LDLCALC 71 04/03/2023   TRIG 110 04/03/2023   CHOLHDL 2.3 04/03/2023   Lab Results  Component Value Date   HGBA1C 6.1 02/15/2024   Lab Results  Component Value Date   VITAMINB12 337 02/15/2024   Lab Results  Component Value Date   TSH 1.16 02/15/2024     ASSESSMENT AND PLAN  63 y.o. year old male with      Cognitive impairment   Reports  decline in cognitive function over five years, with forgetfulness, word-finding difficulty, and occasional disorientation while driving. Performs daily activities and work duties without  significant issues. Family history of dementia in multiple relatives. Initial cognitive screening showed difficulty with MoCA but normal MMSE performance. Differential includes cognitive impairment due to ADHD, but current findings do not support the diagnosis of dementia.  - Order MRI of the brain with contrast to rule out other causes of cognitive decline. - Order MRI of the orbits to assess eye movement issues due to abnormal eyes movement (limited lateral gaze bilaterally) - Refer to neuropsychology for formal cognitive testing.  Vitamin B12 Deficiency Recent labs show low-normal B12 level, suboptimal for cognitive health. B12 deficiency can contribute to cognitive impairment. - Prescribe B12 supplement, 1000 mg daily.  Anxiety Long-standing anxiety treated with Lamictal  and Prozac  for 25 years. Reports uncertainty about medication effectiveness. Anxiety may contribute to cognitive symptoms.       1. Cognitive impairment   2. Abnormal saccadic eye movement   3. Abnormal lateral conjugate gaze   4. B12 deficiency      Patient Instructions  Due to abnormal saccade, will obtain MRI brain and MRI orbit with and without contrast Start B12 supplement daily Referral for formal neuropsychological testing for MCI vs. ADHD  Continue to follow with PCP Return 1 year or sooner if worse.  There are well-accepted and sensible ways to reduce risk for Alzheimers disease and other degenerative brain disorders .  Exercise Daily Walk A daily 20 minute walk should be part of your routine. Disease related apathy can be a significant roadblock to exercise and the only way to overcome this is to make it a daily routine and perhaps have a reward at the end (something your loved one loves to eat or drink perhaps) or a personal trainer coming to the home can also be very useful. Most importantly, the patient is much more likely to exercise if the caregiver / spouse does it with him/her. In general a  structured, repetitive schedule is best.  General Health: Any diseases which effect your body will effect your brain such as a pneumonia, urinary infection, blood clot, heart attack or stroke. Keep contact with your primary care doctor for regular follow ups.  Sleep. A good nights sleep is healthy for the brain. Seven hours is recommended. If you have insomnia or poor sleep habits we can give you some instructions. If you have sleep apnea wear your mask.  Diet: Eating a heart healthy diet is also a good idea; fish and poultry instead of red meat, nuts (mostly non-peanuts), vegetables, fruits, olive oil or canola oil (instead of butter), minimal salt (use other spices to flavor foods), whole grain rice, bread, cereal and pasta and wine in moderation.Research is now showing that the MIND diet, which is a combination of The Mediterranean diet and the DASH diet, is beneficial for cognitive processing and longevity. Information about this diet can be found in The MIND Diet, a book by Annitta Feeling, MS, RDN, and online at [employer]  Finances, Power of [address] and Advance Directives: You should consider putting legal safeguards in place with regard to financial and medical decision making. While the spouse always has power of attorney for medical and financial issues in the absence of any form, you should consider what you want in case the spouse / caregiver is no longer around or capable of making decisions.   Orders Placed This Encounter  Procedures   MR BRAIN W  WO CONTRAST   MR ORBITS W WO CONTRAST   Ambulatory referral to Neuropsychology    Meds ordered this encounter  Medications   cyanocobalamin  (VITAMIN B12) 1000 MCG tablet    Sig: Take 1 tablet (1,000 mcg total) by mouth daily.    Dispense:  90 tablet    Refill:  3    Return in about 1 year (around 05/11/2025).    Pastor Falling, MD 05/11/2024, 4:20 PM  [facility] [address]  [address] [place], [place] [address] [phone]

## 2024-05-12 ENCOUNTER — Encounter: Payer: Self-pay | Admitting: Gastroenterology

## 2024-05-12 ENCOUNTER — Telehealth: Payer: Self-pay | Admitting: Neurology

## 2024-05-12 NOTE — Telephone Encounter (Signed)
 Referral for neuropsychology fax to [facility]. Phone: [phone], Fax: [phone]

## 2024-05-19 ENCOUNTER — Ambulatory Visit: Admitting: Gastroenterology

## 2024-05-19 ENCOUNTER — Encounter: Payer: Self-pay | Admitting: Gastroenterology

## 2024-05-19 VITALS — BP 102/61 | HR 46 | Temp 97.5°F | Resp 15 | Ht 66.0 in | Wt 178.0 lb

## 2024-05-19 DIAGNOSIS — Z860101 Personal history of adenomatous and serrated colon polyps: Secondary | ICD-10-CM | POA: Diagnosis not present

## 2024-05-19 DIAGNOSIS — K5730 Diverticulosis of large intestine without perforation or abscess without bleeding: Secondary | ICD-10-CM

## 2024-05-19 DIAGNOSIS — Z1211 Encounter for screening for malignant neoplasm of colon: Secondary | ICD-10-CM

## 2024-05-19 DIAGNOSIS — D122 Benign neoplasm of ascending colon: Secondary | ICD-10-CM | POA: Diagnosis not present

## 2024-05-19 MED ORDER — SODIUM CHLORIDE 0.9 % IV SOLN: 500.0000 mL | INTRAVENOUS | Status: DC

## 2024-05-19 NOTE — Progress Notes (Signed)
 Sedate, gd SR, tolerated procedure well, VSS, report to RN

## 2024-05-19 NOTE — Patient Instructions (Signed)
 Handouts provided about diverticulosis and polyps.  Resume previous diet.  Continue present medications.  Await pathology results.  Repeat colonoscopy is recommended for surveillance.  The colonoscopy date will be determined after results of today's exam become available for review.   YOU HAD AN ENDOSCOPIC PROCEDURE TODAY AT THE  ENDOSCOPY CENTER:   Refer to the procedure report that was given to you for any specific questions about what was found during the examination.  If the procedure report does not answer your questions, please call your gastroenterologist to clarify.  If you requested that your care partner not be given the details of your procedure findings, then the procedure report has been included in a sealed envelope for you to review at your convenience later.  YOU SHOULD EXPECT: Some feelings of bloating in the abdomen. Passage of more gas than usual.  Walking can help get rid of the air that was put into your GI tract during the procedure and reduce the bloating. If you had a lower endoscopy (such as a colonoscopy or flexible sigmoidoscopy) you may notice spotting of blood in your stool or on the toilet paper. If you underwent a bowel prep for your procedure, you may not have a normal bowel movement for a few days.  Please Note:  You might notice some irritation and congestion in your nose or some drainage.  This is from the oxygen used during your procedure.  There is no need for concern and it should clear up in a day or so.  SYMPTOMS TO REPORT IMMEDIATELY:  Following lower endoscopy (colonoscopy or flexible sigmoidoscopy):  Excessive amounts of blood in the stool  Significant tenderness or worsening of abdominal pains  Swelling of the abdomen that is new, acute  Fever of 100F or higher  For urgent or emergent issues, a gastroenterologist can be reached at any hour by calling [phone]. Do not use MyChart messaging for urgent concerns.    DIET:  We do  recommend a small meal at first, but then you may proceed to your regular diet.  Drink plenty of fluids but you should avoid alcoholic beverages for 24 hours.  ACTIVITY:  You should plan to take it easy for the rest of today and you should NOT DRIVE or use heavy machinery until tomorrow (because of the sedation medicines used during the test).    FOLLOW UP: Our staff will call the number listed on your records the next business day following your procedure.  We will call around 7:15- 8:00 am to check on you and address any questions or concerns that you may have regarding the information given to you following your procedure. If we do not reach you, we will leave a message.     If any biopsies were taken you will be contacted by phone or by letter within the next 1-3 weeks.  Please call us  at [phone] if you have not heard about the biopsies in 3 weeks.    SIGNATURES/CONFIDENTIALITY: You and/or your care partner have signed paperwork which will be entered into your electronic medical record.  These signatures attest to the fact that that the information above on your After Visit Summary has been reviewed and is understood.  Full responsibility of the confidentiality of this discharge information lies with you and/or your care-partner.

## 2024-05-19 NOTE — Op Note (Signed)
 [facility] Patient Name: Colton Mcconnell Comes Procedure Date: 05/19/2024 2:28 PM MRN: [id number] Endoscopist: Victory L. Legrand , MD, [id number] Age: 63 Referring MD:  Date of Birth: [birth year] Gender: Male Account #: [id number] Procedure:                Colonoscopy Indications:              Surveillance: Personal history of adenomatous                            polyps on last colonoscopy > 5 years ago                           Diminutive TA in 2016 (Dr Teressa) Medicines:                Monitored Anesthesia Care Procedure:                Pre-Anesthesia Assessment:                           - Prior to the procedure, a History and Physical                            was performed, and patient medications and                            allergies were reviewed. The patient's tolerance of                            previous anesthesia was also reviewed. The risks                            and benefits of the procedure and the sedation                            options and risks were discussed with the patient.                            All questions were answered, and informed consent                            was obtained. Prior Anticoagulants: The patient has                            taken no anticoagulant or antiplatelet agents. ASA                            Grade Assessment: II - A patient with mild systemic                            disease. After reviewing the risks and benefits,                            the patient was deemed in satisfactory condition to  undergo the procedure.                           After obtaining informed consent, the colonoscope                            was passed under direct vision. Throughout the                            procedure, the patient's blood pressure, pulse, and                            oxygen saturations were monitored continuously. The                            Olympus Scope SN: [id number] was  introduced through                            the anus and advanced to the the cecum, identified                            by appendiceal orifice and ileocecal valve. The                            colonoscopy was performed without difficulty. The                            patient tolerated the procedure well. The quality                            of the bowel preparation was good. The ileocecal                            valve, appendiceal orifice, and rectum were                            photographed. Scope In: 2:46:35 PM Scope Out: 3:00:12 PM Scope Withdrawal Time: 0 hours 9 minutes 22 seconds  Total Procedure Duration: 0 hours 13 minutes 37 seconds  Findings:                 The perianal and digital rectal examinations were                            normal.                           Repeat examination of right colon under NBI                            performed.                           A diminutive polyp was found in the proximal  ascending colon. The polyp was sessile. The polyp                            was removed with a cold snare. Resection and                            retrieval were complete.                           A few diverticula were found in the left colon and                            right colon.                           The exam was otherwise without abnormality on                            direct and retroflexion views. Complications:            No immediate complications. Estimated Blood Loss:     Estimated blood loss was minimal. Impression:               - One diminutive polyp in the proximal ascending                            colon, removed with a cold snare. Resected and                            retrieved.                           - Diverticulosis in the left colon and in the right                            colon.                           - The examination was otherwise normal on direct                             and retroflexion views. Recommendation:           - Patient has a contact number available for                            emergencies. The signs and symptoms of potential                            delayed complications were discussed with the                            patient. Return to normal activities tomorrow.                            Written discharge instructions were provided to the  patient.                           - Resume previous diet.                           - Continue present medications.                           - Await pathology results.                           - Repeat colonoscopy is recommended for                            surveillance. The colonoscopy date will be                            determined after pathology results from today's                            exam become available for review. Rasaan Brotherton L. Legrand, MD 05/19/2024 3:04:20 PM This report has been signed electronically.

## 2024-05-19 NOTE — Progress Notes (Signed)
 Called to room to assist during endoscopic procedure.  Patient ID and intended procedure confirmed with present staff. Received instructions for my participation in the procedure from the performing physician.

## 2024-05-19 NOTE — Progress Notes (Signed)
 History and Physical:  This patient presents for endoscopic testing for: Encounter Diagnosis  Name Primary?   History of adenomatous polyp of colon Yes    Surveillance colonoscopy today for Hx colon polyps. Diminutive TA and melanosis last colonoscopy Dec 2016 (Dr Teressa)  Patient denies chronic abdominal pain, rectal bleeding, constipation or diarrhea.   Patient is otherwise without complaints or active issues today.   Past Medical History: Past Medical History:  Diagnosis Date   Anxiety    Arthritis    neck, lower back   Back pain    sporadic   Disc degeneration, lumbosacral    at 4-5, 5-1   Erectile dysfunction    Facet arthritis of lumbosacral region    at 4-5 and neural foraminal stenosis   Maxillary sinusitis    Recurrent   Mouth lesion    Spinal stenosis    laminotomy defect at 4-5, 5-1, pars defect at 4     Past Surgical History: Past Surgical History:  Procedure Laterality Date   BACK SURGERY     cysts removed from spine age 89 and 20   BACK SURGERY  2022   L3-L4   COLONOSCOPY      Allergies: No Known Allergies  Outpatient Meds: Current Outpatient Medications  Medication Sig Dispense Refill   cyanocobalamin  (VITAMIN B12) 1000 MCG tablet Take 1 tablet (1,000 mcg total) by mouth daily. 90 tablet 3   FLUoxetine  (PROZAC ) 20 MG tablet Take 3 tablets (60 mg total) by mouth daily. 90 tablet 0   lamoTRIgine  (LAMICTAL ) 200 MG tablet Take 200 mg by mouth daily.     Multiple Vitamins-Minerals (MULTIVITAMIN WITH MINERALS) tablet Take 1 tablet by mouth every morning.     traZODone (DESYREL) 50 MG tablet Take 50 mg by mouth.     busPIRone (BUSPAR) 15 MG tablet Take 1/2 tab 2 times daily for a week, then 1 twice a day (Patient not taking: Reported on 05/19/2024)     HYDROcodone -acetaminophen  (NORCO) 10-325 MG tablet Take 1 tablet by mouth every 8 (eight) hours as needed.     sildenafil  (REVATIO ) 20 MG tablet Take 3-4 tablets (60-80 mg total) by mouth at bedtime as  needed. (Patient not taking: Reported on 05/19/2024) 30 tablet 3   Current Facility-Administered Medications  Medication Dose Route Frequency Provider Last Rate Last Admin   0.9 %  sodium chloride  infusion  500 mL Intravenous Continuous Danis, Victory CROME III, MD          ___________________________________________________________________ Objective   Exam:  BP (!) 111/59   Pulse (!) 58   Temp (!) 97.5 F (36.4 C) (Temporal)   Ht 5' 6 (1.676 m)   Wt 178 lb (80.7 kg)   SpO2 98%   BMI 28.73 kg/m   CV: regular , S1/S2 Resp: clear to auscultation bilaterally, normal RR and effort noted GI: soft, no tenderness, with active bowel sounds.   Assessment: Encounter Diagnosis  Name Primary?   History of adenomatous polyp of colon Yes     Plan: Colonoscopy   The benefits and risks of the planned procedure(s) were described in detail with the patient or (when appropriate) their health care proxy.  Risks were outlined as including, but not limited to, bleeding, infection, perforation, adverse medication reaction leading to cardiac or pulmonary decompensation, pancreatitis (if ERCP).  The limitation of incomplete mucosal visualization was also discussed.  No guarantees or warranties were given.  The patient is appropriate for an endoscopic procedure in the ambulatory setting.   -  Victory Brand, MD

## 2024-05-20 ENCOUNTER — Telehealth: Payer: Self-pay

## 2024-05-20 NOTE — Telephone Encounter (Signed)
 Follow up call to pt, no answer.

## 2024-05-24 ENCOUNTER — Ambulatory Visit: Payer: Self-pay | Admitting: Neurology

## 2024-05-24 ENCOUNTER — Ambulatory Visit (INDEPENDENT_AMBULATORY_CARE_PROVIDER_SITE_OTHER)

## 2024-05-24 DIAGNOSIS — H518 Other specified disorders of binocular movement: Secondary | ICD-10-CM

## 2024-05-24 DIAGNOSIS — R4189 Other symptoms and signs involving cognitive functions and awareness: Secondary | ICD-10-CM | POA: Diagnosis not present

## 2024-05-24 DIAGNOSIS — H5581 Saccadic eye movements: Secondary | ICD-10-CM

## 2024-05-24 LAB — SURGICAL PATHOLOGY

## 2024-05-24 MED ORDER — GADOTERIDOL 279.3 MG/ML IV SOLN
15.0000 mL | Freq: Once | INTRAVENOUS | Status: AC | PRN
  Administered 2024-05-24: 15 mL via INTRAVENOUS

## 2024-05-24 NOTE — Progress Notes (Signed)
 Please call and inform patient that his recent Brain MRI is within normal limits. In particular, there were no acute findings, such as a stroke, or mass or blood products. Please keep any upcoming appointments or tests and  call us  with any interim questions, concerns, problems or updates. Thanks,   Pastor Falling, MD

## 2024-05-24 NOTE — Progress Notes (Signed)
 Please call and advise the patient that the recent orbit MRI is within normal limits, no acute abnormalities seen. Please remind patient to keep any upcoming appointments or tests and to call us  with any interim questions, concerns, problems or updates. Thanks,   Pastor Falling, MD

## 2024-05-25 ENCOUNTER — Ambulatory Visit: Payer: Self-pay | Admitting: Gastroenterology

## 2024-06-07 ENCOUNTER — Encounter: Admitting: Internal Medicine

## 2024-10-23 ENCOUNTER — Other Ambulatory Visit: Payer: Self-pay

## 2024-10-23 ENCOUNTER — Encounter (HOSPITAL_BASED_OUTPATIENT_CLINIC_OR_DEPARTMENT_OTHER): Payer: Self-pay

## 2024-10-23 ENCOUNTER — Emergency Department (HOSPITAL_BASED_OUTPATIENT_CLINIC_OR_DEPARTMENT_OTHER)
Admission: EM | Admit: 2024-10-23 | Discharge: 2024-10-23 | Disposition: A | Discharge status: Home / Self Care | Attending: Emergency Medicine | Admitting: Emergency Medicine

## 2024-10-23 ENCOUNTER — Emergency Department (HOSPITAL_BASED_OUTPATIENT_CLINIC_OR_DEPARTMENT_OTHER)

## 2024-10-23 DIAGNOSIS — R10A2 Flank pain, left side: Secondary | ICD-10-CM

## 2024-10-23 DIAGNOSIS — N3289 Other specified disorders of bladder: Secondary | ICD-10-CM | POA: Insufficient documentation

## 2024-10-23 LAB — CBC WITH DIFFERENTIAL/PLATELET
Abs Immature Granulocytes: 0.05 K/uL (ref 0.00–0.07)
Basophils Absolute: 0 K/uL (ref 0.0–0.1)
Basophils Relative: 0 %
Eosinophils Absolute: 0.1 K/uL (ref 0.0–0.5)
Eosinophils Relative: 1 %
HCT: 45.4 % (ref 39.0–52.0)
Hemoglobin: 15.6 g/dL (ref 13.0–17.0)
Immature Granulocytes: 1 %
Lymphocytes Relative: 15 %
Lymphs Abs: 1.3 K/uL (ref 0.7–4.0)
MCH: 30.2 pg (ref 26.0–34.0)
MCHC: 34.4 g/dL (ref 30.0–36.0)
MCV: 88 fL (ref 80.0–100.0)
Monocytes Absolute: 0.6 K/uL (ref 0.1–1.0)
Monocytes Relative: 7 %
Neutro Abs: 6.5 K/uL (ref 1.7–7.7)
Neutrophils Relative %: 76 %
Platelets: 220 K/uL (ref 150–400)
RBC: 5.16 MIL/uL (ref 4.22–5.81)
RDW: 12.5 % (ref 11.5–15.5)
WBC: 8.4 K/uL (ref 4.0–10.5)
nRBC: 0 % (ref 0.0–0.2)

## 2024-10-23 LAB — URINALYSIS, ROUTINE W REFLEX MICROSCOPIC
Bilirubin Urine: NEGATIVE
Glucose, UA: NEGATIVE mg/dL
Hgb urine dipstick: NEGATIVE
Ketones, ur: NEGATIVE mg/dL
Leukocytes,Ua: NEGATIVE
Nitrite: NEGATIVE
Protein, ur: NEGATIVE mg/dL
Specific Gravity, Urine: 1.025 (ref 1.005–1.030)
pH: 6 (ref 5.0–8.0)

## 2024-10-23 LAB — BASIC METABOLIC PANEL WITH GFR
Anion gap: 13 (ref 5–15)
BUN: 20 mg/dL (ref 8–23)
CO2: 23 mmol/L (ref 22–32)
Calcium: 9.8 mg/dL (ref 8.9–10.3)
Chloride: 101 mmol/L (ref 98–111)
Creatinine, Ser: 0.94 mg/dL (ref 0.61–1.24)
GFR, Estimated: >60 mL/min
Glucose, Bld: 102 mg/dL — ABNORMAL HIGH (ref 70–99)
Potassium: 4 mmol/L (ref 3.5–5.1)
Sodium: 137 mmol/L (ref 135–145)

## 2024-10-23 LAB — LIPASE, BLOOD: Lipase: 47 U/L (ref 11–51)

## 2024-10-23 MED ORDER — IOHEXOL 300 MG/ML  SOLN
100.0000 mL | Freq: Once | INTRAMUSCULAR | Status: AC | PRN
  Administered 2024-10-23: 100 mL via INTRAVENOUS

## 2024-10-23 NOTE — Discharge Instructions (Signed)
 Your blood work and CT scans were overall reassuring.  However, on your CT scan there was some asymmetry, or any evidence of the bladder wall.  This could be a potential early sign of bladder cancer or a bladder mass.  I recommended that you follow-up with a urologist.  Please call the number above to schedule an appointment.  You can take over-the-counter Tylenol  and ibuprofen as needed for your flank pain.  Call your primary care doctor's office to schedule a follow-up visit for this issue.

## 2024-10-23 NOTE — ED Triage Notes (Signed)
 L sided flank pain for 3 weeks. States radiates to L side of abd. Nausea, chills and decreased pressure in urine.  Denies dysuria or hematuria

## 2024-10-23 NOTE — ED Notes (Signed)

## 2024-10-23 NOTE — ED Provider Notes (Signed)
 " [facility] Provider Note   CSN: [id number] Arrival date & time: 10/23/24  9157     Patient presents with: Flank Pain   Colton Mcconnell is a 63 y.o. male presenting to ED about 3 weeks of left flank pain.  Also left abdominal pain.  Patient reports that has been persistent for 3 weeks.  He has had a diminished appetite.  He is moving his bowels regularly.  There is reports some urinary pressure.  Reports a history of diverticulosis as well.  Says he took a brief flight from [place] to [place] and then [place] yesterday and was doubled over in pain because he feels that sitting a certain way worsens his pain.  He denies any leg swelling, calf pain, history of DVT or PE.   HPI     Prior to Admission medications  Medication Sig Start Date End Date Taking? Authorizing Provider  busPIRone (BUSPAR) 15 MG tablet Take 1/2 tab 2 times daily for a week, then 1 twice a day Patient not taking: Reported on 05/19/2024 03/02/24   [provider]  cyanocobalamin  (VITAMIN B12) 1000 MCG tablet Take 1 tablet (1,000 mcg total) by mouth daily. 05/11/24   Camara, Amadou, MD  FLUoxetine  (PROZAC ) 20 MG tablet Take 3 tablets (60 mg total) by mouth daily. 01/19/19   Paz, Jose E, MD  HYDROcodone -acetaminophen  (NORCO) 10-325 MG tablet Take 1 tablet by mouth every 8 (eight) hours as needed.    [provider]  lamoTRIgine  (LAMICTAL ) 200 MG tablet Take 200 mg by mouth daily. 10/06/22   [provider]  Multiple Vitamins-Minerals (MULTIVITAMIN WITH MINERALS) tablet Take 1 tablet by mouth every morning.    [provider]  sildenafil  (REVATIO ) 20 MG tablet Take 3-4 tablets (60-80 mg total) by mouth at bedtime as needed. Patient not taking: Reported on 05/19/2024 05/18/23   Paz, Jose E, MD  traZODone (DESYREL) 50 MG tablet Take 50 mg by mouth. 03/02/24   [provider]    Allergies: Patient has no known allergies.    Review of  Systems  Updated Vital Signs BP 124/82   Pulse 69   Temp 98.4 F (36.9 C) (Oral)   Resp 17   SpO2 100%   Physical Exam Constitutional:      General: He is not in acute distress. HENT:     Head: Normocephalic and atraumatic.  Eyes:     Conjunctiva/sclera: Conjunctivae normal.     Pupils: Pupils are equal, round, and reactive to light.  Cardiovascular:     Rate and Rhythm: Normal rate and regular rhythm.  Pulmonary:     Effort: Pulmonary effort is normal. No respiratory distress.  Abdominal:     General: There is no distension.     Tenderness: There is abdominal tenderness in the left upper quadrant. There is left CVA tenderness.  Skin:    General: Skin is warm and dry.  Neurological:     General: No focal deficit present.     Mental Status: He is alert. Mental status is at baseline.  Psychiatric:        Mood and Affect: Mood normal.        Behavior: Behavior normal.     (all labs ordered are listed, but only abnormal results are displayed) Labs Reviewed  BASIC METABOLIC PANEL WITH GFR - Abnormal; Notable for the following components:      Result Value   Glucose, Bld 102 (*)  All other components within normal limits  URINALYSIS, ROUTINE W REFLEX MICROSCOPIC  CBC WITH DIFFERENTIAL/PLATELET  LIPASE, BLOOD    EKG: None  Radiology: CT ABDOMEN PELVIS W CONTRAST Result Date: 10/23/2024 CLINICAL DATA:  Left upper quadrant pain. EXAM: CT ABDOMEN AND PELVIS WITH CONTRAST TECHNIQUE: Multidetector CT imaging of the abdomen and pelvis was performed using the standard protocol following bolus administration of intravenous contrast. RADIATION DOSE REDUCTION: This exam was performed according to the departmental dose-optimization program which includes automated exposure control, adjustment of the mA and/or kV according to patient size and/or use of iterative reconstruction technique. CONTRAST:  OMNIPAQUE  IOHEXOL  300 MG/ML  SOLN COMPARISON:  None Available. FINDINGS:  Lower chest: No acute abnormality. Hepatobiliary: No focal liver abnormality is seen. No gallstones, gallbladder wall thickening, or biliary dilatation. Pancreas: Unremarkable. Spleen: Unremarkable. Adrenals/Urinary Tract: Adrenal glands are unremarkable. Kidneys enhance symmetrically. No renal or ureteral calculi. No hydronephrosis. Subcentimeter focal hypodensities in the left kidney are too small to definitively characterize, although statistically favored to represent small cysts. Bladder is partially distended with mild nonspecific asymmetric wall thickening along the right bladder wall (series 11, image 75). Stomach/Bowel: Stomach is within normal limits. Appendix appears normal. No evidence of bowel wall thickening, distention, or inflammatory changes. Vascular/Lymphatic: Abdominal aorta is normal in caliber with atherosclerotic calcification. No enlarged abdominal or pelvic lymph nodes. Reproductive: Prostate is unremarkable. Other: No abdominopelvic ascites. No intraperitoneal free air. No abdominal wall hernia. Musculoskeletal: Postoperative changes related to posterior fusion at L4-S1. Redemonstrated stepwise grade 1 retrolisthesis of L1 on L2, L2 on L3 and L3 on L4. Degenerative changes of the thoracolumbar spine. No acute osseous abnormality. No suspicious osseous lesion. IMPRESSION: 1. No acute localizing findings in the abdomen or pelvis. 2. Bladder is partially distended with mild nonspecific asymmetric focal wall thickening along the right bladder wall. Consider cystoscopy for direct visualization to exclude an underlying mass. 3.  Aortic Atherosclerosis (ICD10-I70.0). Electronically Signed   By: Harrietta Sherry M.D.   On: 10/23/2024 12:38     Procedures   Medications Ordered in the ED  iohexol  (OMNIPAQUE ) 300 MG/ML solution 100 mL (100 mLs Intravenous Contrast Given 10/23/24 1045)                                    Medical Decision Making Amount and/or Complexity of Data  Reviewed Labs: ordered. Radiology: ordered.  Risk Prescription drug management.   This patient presents to the ED with concern for left flank pain. This involves an extensive number of treatment options, and is a complaint that carries with it a high risk of complications and morbidity.  The differential diagnosis includes diverticulitis versus ureteral colic versus other   I ordered and personally interpreted labs.  The pertinent results include: No emergent findings  I ordered imaging studies including CT abdomen pelvis with contrast I independently visualized and interpreted imaging which bladder wall asymmetry, no other emergent findings  I agree with the radiologist interpretation  I have reviewed the patients home medicines and have made adjustments as needed  Test Considered: doubt testicular pathology    Dispostion:  After consideration of the diagnostic results and the patients response to treatment, I feel that the patent would benefit from outpatient follow-up.  Referral given to urology for bladder thickening      Final diagnoses:  Bladder wall thickening  Left flank pain    ED Discharge Orders  None          Cottie Donnice PARAS, MD 10/23/24 1500  "

## 2024-10-23 NOTE — ED Notes (Signed)
 PT has had 3 weeks of L flank pain, now it's radiating to his L anterior rib. No recent trauma, no noticeable hematuria, but has had a weakened stream. Also had  fever spikes unassociated with the pain spikes, where he breaks out into a full body sweat. Was on a flight and the pain got much worse to his L lower rib.

## 2024-10-23 NOTE — ED Notes (Signed)
 ED Provider at bedside.

## 2024-10-24 ENCOUNTER — Telehealth: Payer: Self-pay

## 2024-10-24 NOTE — Telephone Encounter (Signed)
 Chief Complaint SEVERE ABDOMINAL PAIN - Severe pain in abdomen Reason for Call Symptomatic / Request for Health Information Initial Comment Caller states her husband is having pain on his left side around to under his ribs. He starts sweating and the pain in severe. Translation No Nurse Assessment Nurse: Nerissa, RN, Charleen Date/Time (Eastern Time): 10/23/2024 8:16:56 AM Confirm and document reason for call. If symptomatic, describe symptoms. ---Caller states 2 weeks ago started having pain in the left mid back and now it is radiating to the front under the rib cage. Pain is 6/10 that is dull constant pain. States he has had nausea and yesterday started having sweating. Does the patient have any new or worsening symptoms? ---Yes Will a triage be completed? ---Yes Related visit to physician within the last 2 weeks? ---No Does the PT have any chronic conditions? (i.e. diabetes, asthma, this includes High risk factors for pregnancy, etc.) ---Yes List chronic conditions. ---anxiety Is this a behavioral health or substance abuse call? ---No Guidelines Guideline Title Affirmed Question Affirmed Notes Nurse Date/Time Titus Time) Abdominal Pain - Upper [1] Pain lasts > 10 minutes AND [2] age > 29 Nerissa, RN, Charleen 10/23/2024 8:19:02 AM PLEASE NOTE: All timestamps contained within this report are represented as Eastern Standard Time. CONFIDENTIALTY NOTICE: This fax transmission is intended only for the addressee. It contains information that is legally privileged, confidential or otherwise protected from use or disclosure. If you are not the intended recipient, you are strictly prohibited from reviewing, disclosing, copying using or disseminating any of this information or taking any action in reliance on or regarding this information. If you have received this fax in error, please notify us  immediately by telephone so that we can arrange for its return to us . Phone: [phone],  Toll-Free: [phone], Fax: [phone] [facility] [birth year] Page: 1 of2 CallId: [id number] Disp. Time Titus Time) Disposition Final User 10/23/2024 8:15:50 AM Send to Urgent Queue Myrna Ladora Caldron 10/23/2024 8:21:10 AM Go to ED Now Yes Deyton, RN, Charleen Final Disposition 10/23/2024 8:21:10 AM Go to ED Now Yes Deyton, RN, Charleen [place] Disagree/Comply Comply Caller Understands Yes PreDisposition InappropriateToAsk Care Advice Given Per Guideline GO TO ED NOW: * You need to be seen in the Emergency Department. * Go to the ED at ___________ Hospital. * Leave now. Drive carefully. NOTE TO TRIAGER - DRIVING: * Another adult should drive. * Patient should not delay going to the emergency department. CARE ADVICE given per Abdominal Pain - Upper (Adult) guideline. CALL EMS 911 IF: * Confusion occurs * Passes out or becomes too weak to stand * Severe difficulty breathing occurs. Referrals [facility] - ED

## 2024-10-24 NOTE — Telephone Encounter (Signed)
 Initial Comment Caller states that her husband has been sharp pain that goes from his the left side of his back to under his rib. She states that he is having alot of pain, ho does not have an appetite. Translation No Disp. Time Titus Time) Disposition Final User 10/23/2024 8:03:56 AM Send to Urgent Queue Abagail Nian 10/23/2024 8:09:14 AM Clinical Call Yes Artis, RN, Angelica Final Disposition 10/23/2024 8:09:14 AM Clinical Call Yes Artis, RN, Angelica Comments User: Chuck Artis, RN Date/Time Titus Time): 10/23/2024 8:09:06 AM Caller is not currently with patient, he is driving home from work. Advised to call back once they are together so we can triage and make a recommendation. Caller verbalizes understanding

## 2024-10-24 NOTE — Telephone Encounter (Signed)
 Pt seen at ED

## 2024-10-26 ENCOUNTER — Ambulatory Visit (INDEPENDENT_AMBULATORY_CARE_PROVIDER_SITE_OTHER): Admitting: Internal Medicine

## 2024-10-26 VITALS — BP 129/65 | HR 64 | Temp 98.4°F | Resp 16 | Ht 66.0 in | Wt 176.0 lb

## 2024-10-26 DIAGNOSIS — R9341 Abnormal radiologic findings on diagnostic imaging of renal pelvis, ureter, or bladder: Secondary | ICD-10-CM

## 2024-10-26 DIAGNOSIS — N3289 Other specified disorders of bladder: Secondary | ICD-10-CM

## 2024-10-26 NOTE — Patient Instructions (Signed)
 Go to the front desk for the checkout Please make an appointment for a physical exam in 3 months     BLADDER WALL THICKENING:   Your CT scan showed thickening of the bladder wall, it is unclear if that explains the pain you have.  Nevertheless you need to see the specialist:  -You have been referred to [facility] for further evaluation. Please call them at [phone]

## 2024-10-26 NOTE — Assessment & Plan Note (Signed)
 Bladder wall thickening The patient experienced left-sided flank and abdominal pain leading to ER visit. The workup included a normal CBC and a UA however CT scan showed bladder wall thickening.  At this point the pain is resolved, physical exam is benign.  Etiology unclear, passed a kidney stone?  Zoster sine herpete?  Recommend observation.    As far as the abnormal appearance of the bladder on CT, he has been referred to [facility] for further evaluation. RTC: Recommend to come back in 3 months for CPX

## 2024-10-26 NOTE — Progress Notes (Signed)
 "  Subjective:    Patient ID: Colton Mcconnell, male    DOB: [birth year], 63 y.o.   MRN: [id number]  DOS:  10/26/2024 Acute visit, to discuss ER visit Discussed the use of AI scribe software for clinical note transcription with the patient, who gave verbal consent to proceed.  History of Present Illness Colton Mcconnell is a 63 year old male who presents with back and abdominal pain.  Back and abdominal pain - Onset of  left flank and mid-back pain approximately one month ago, initially suspected to be a muscle pull - Two weeks after onset, pain radiated to the left upper quadrant under the rib cage - Trial of Prilosec without relief - Pain persisted and became severe, prompting emergency room visit three days ago - CT imaging and blood work performed in the emergency room - Pain resolved within 1 to 2 days after emergency room visit and is currently resolved  Associated symptoms - Sweating episodes - Nausea - No documented fever. No chills, vomiting, diarrhea, rash, dysuria, hematuria, urinary difficulty, or blood in the stool.  Musculoskeletal history - History of prior back surgery - Lumbar spine arthritis   Wt Readings from Last 3 Encounters:  10/26/24 176 lb (79.8 kg)  05/19/24 178 lb (80.7 kg)  05/11/24 174 lb (78.9 kg)      Review of Systems See above   Past Medical History:  Diagnosis Date   Anxiety    Arthritis    neck, lower back   Back pain    sporadic   Disc degeneration, lumbosacral    at 4-5, 5-1   Erectile dysfunction    Facet arthritis of lumbosacral region    at 4-5 and neural foraminal stenosis   Maxillary sinusitis    Recurrent   Mouth lesion    Spinal stenosis    laminotomy defect at 4-5, 5-1, pars defect at 4    Past Surgical History:  Procedure Laterality Date   BACK SURGERY     cysts removed from spine age 64 and 18   BACK SURGERY  2022   L3-L4   COLONOSCOPY      Current Outpatient Medications  Medication Instructions    cyanocobalamin  (VITAMIN B12) 1,000 mcg, Oral, Daily   FLUoxetine  (PROZAC ) 60 mg, Oral, Daily   HYDROcodone -acetaminophen  (NORCO) 10-325 MG tablet 1 tablet, Every 8 hours PRN   lamoTRIgine  (LAMICTAL ) 200 mg, Daily   Multiple Vitamins-Minerals (MULTIVITAMIN WITH MINERALS) tablet 1 tablet, Every morning   traZODone (DESYREL) 50 mg       Objective:   Physical Exam Skin:       BP 129/65 (BP Location: Right Arm, Patient Position: Sitting, Cuff Size: Normal)   Pulse 64   Temp 98.4 F (36.9 C) (Oral)   Resp 16   Ht 5' 6 (1.676 m)   Wt 176 lb (79.8 kg)   SpO2 99%   BMI 28.41 kg/m  General:   Well developed, NAD, BMI noted.  HEENT:  Normocephalic . Face symmetric, atraumatic Lungs:  CTA B Normal respiratory effort, no intercostal retractions, no accessory muscle use. Heart: RRR,  no murmur.  Abdomen:  Not distended, soft, non-tender. No rebound or rigidity.  No CVA tenderness Skin: No rash on the abdomen or back Lower extremities: no pretibial edema bilaterally  Neurologic:  alert & oriented X3.  Speech normal, gait appropriate for age and unassisted Psych--  Cognition and judgment appear intact.  Cooperative with normal attention span and concentration.  Behavior appropriate. No  anxious or depressed appearing.     Assessment     Assessment Prediabetes A1c 5.9 (06-2013) Anxiety per psych Use to see Dr. Stacia  Back pain, saw Ortho 2016, had an MRI, dx spinal stenosis, s/p surgery 11-2016  GU: --ED  --H/o low testost, dx per urology, declined HRT or more testing --H/o Peyronie's Dz  DX@urology  Sees derm regularly , had few lesions removed  Amblyopia +FH CAD Cabg age 63  Assessment & Plan Bladder wall thickening The patient experienced left-sided flank and abdominal pain leading to ER visit. The workup included a normal CBC and a UA however CT scan showed bladder wall thickening.  At this point the pain is resolved, physical exam is benign.  Etiology unclear,  passed a kidney stone?  Zoster sine herpete?  Recommend observation.    As far as the abnormal appearance of the bladder on CT, he has been referred to [facility] for further evaluation. RTC: Recommend to come back in 3 months for CPX    "

## 2024-11-28 ENCOUNTER — Emergency Department (HOSPITAL_BASED_OUTPATIENT_CLINIC_OR_DEPARTMENT_OTHER): Admission: EM | Admit: 2024-11-28 | Discharge: 2024-11-28 | Disposition: A | Discharge status: Home / Self Care

## 2024-11-28 ENCOUNTER — Other Ambulatory Visit: Payer: Self-pay

## 2024-11-28 ENCOUNTER — Encounter (HOSPITAL_BASED_OUTPATIENT_CLINIC_OR_DEPARTMENT_OTHER): Payer: Self-pay

## 2024-11-28 DIAGNOSIS — R21 Rash and other nonspecific skin eruption: Secondary | ICD-10-CM | POA: Insufficient documentation

## 2024-11-28 MED ORDER — PREDNISONE 10 MG (21) PO TBPK: ORAL_TABLET | Freq: Every day | ORAL | 0 refills | Status: AC

## 2024-11-28 NOTE — ED Triage Notes (Signed)
 Reports itchy rash starting on waist since yesterday. States spread all over body since. Rash noted to waistline, underarms and chest.   Denies known allergen exposures Denies difficulty breathing/swallowing

## 2024-11-28 NOTE — Discharge Instructions (Signed)
 Please follow-up with your primary doctor, your dermatologist and your rheumatologist.  We are prescribing you an oral steroid to take.  You may also take over-the-counter Zyrtec or Benadryl to help with the itching.  Return for fevers, chills, severe headache, seizures, chest pain, shortness of breath, rash continues to spread, becomes painful, develops blisters, develops foul odor or starts draining pus.  Or, please return if you develop any new or worrisome symptoms that are concerning to you.

## 2024-11-28 NOTE — ED Provider Notes (Addendum)
 " [facility] Provider Note   CSN: [id number] Arrival date & time: 11/28/24  9257     Patient presents with: Rash   Colton Mcconnell is a 64 y.o. male.   Is a 64 year old male presenting to the emergency department for evaluation of an itchy rash.  It started yesterday.  Noted small area around waistline.  It has spread up the torso.  Did not sleep well last night due to itchiness.  Did not take any medications for it.  He denies any new allergic contacts or medications.   Rash      Prior to Admission medications  Medication Sig Start Date End Date Taking? Authorizing Provider  predniSONE  (STERAPRED UNI-PAK 21 TAB) 10 MG (21) TBPK tablet Take by mouth daily. Take 6 tabs by mouth daily  for 2 days, then 5 tabs for 2 days, then 4 tabs for 2 days, then 3 tabs for 2 days, 2 tabs for 2 days, then 1 tab by mouth daily for 2 days 11/28/24  Yes Xenia Nile, Caron PARAS, DO  cyanocobalamin  (VITAMIN B12) 1000 MCG tablet Take 1 tablet (1,000 mcg total) by mouth daily. 05/11/24   Camara, Amadou, MD  FLUoxetine  (PROZAC ) 20 MG tablet Take 3 tablets (60 mg total) by mouth daily. 01/19/19   Paz, Jose E, MD  HYDROcodone -acetaminophen  (NORCO) 10-325 MG tablet Take 1 tablet by mouth every 8 (eight) hours as needed.    [provider]  lamoTRIgine  (LAMICTAL ) 200 MG tablet Take 200 mg by mouth daily. 10/06/22   [provider]  Multiple Vitamins-Minerals (MULTIVITAMIN WITH MINERALS) tablet Take 1 tablet by mouth every morning.    [provider]  traZODone (DESYREL) 50 MG tablet Take 50 mg by mouth. 03/02/24   [provider]    Allergies: Patient has no known allergies.    Review of Systems  Skin:  Positive for rash.    Updated Vital Signs BP 125/69   Pulse 69   Temp 97.7 F (36.5 C) (Oral)   Resp 16   SpO2 98%   Physical Exam Vitals and nursing note reviewed.  Constitutional:      General: He is not in acute distress.     Appearance: He is not toxic-appearing.  HENT:     Head: Normocephalic.     Mouth/Throat:     Mouth: Mucous membranes are moist.     Pharynx: No oropharyngeal exudate or posterior oropharyngeal erythema.  Eyes:     Conjunctiva/sclera: Conjunctivae normal.  Cardiovascular:     Rate and Rhythm: Normal rate.  Pulmonary:     Effort: Pulmonary effort is normal.  Abdominal:     General: Abdomen is flat.  Musculoskeletal:        General: Normal range of motion.  Skin:    Capillary Refill: Capillary refill takes less than 2 seconds.     Findings: Rash present.  Neurological:     Mental Status: He is alert and oriented to person, place, and time.  Psychiatric:        Mood and Affect: Mood normal.        Behavior: Behavior normal.     (all labs ordered are listed, but only abnormal results are displayed) Labs Reviewed - No data to display  EKG: None  Radiology: No results found.   Procedures   Medications Ordered in the ED - No data to display  Medical Decision Making This is a 64 year old male presenting emergency department for evaluation of a rash.  He is afebrile nontachycardic, normotensive.  Physical exam with what appears to be coalescing hives around his waistline also has some lesions scattered about the torso and in left upper armpit.  Does not involve oral mucosa.  It does not appear to be bacterial/cellulitic. He has no blistering.  Low suspicion for SJS/TEN.  Overall patient is quite well-appearing; no systemic symptoms that would warrant lab workup here in the ED. Per chart review does follow with rheumatology and dermatology.  Perhaps rash is autoimmune?.  Will trial steroids and have patient follow-up with his PCP, dermatologist and rheumatologist.  He was given strict return precautions if rash would worsen or develop systemic symptoms to return to the nearest emergency department.  Discharged in stable  condition.  Risk Prescription drug management.       Final diagnoses:  Rash    ED Discharge Orders          Ordered    predniSONE  (STERAPRED UNI-PAK 21 TAB) 10 MG (21) TBPK tablet  Daily        11/28/24 0803               Neysa Caron PARAS, DO 11/28/24 0801    Neysa Caron PARAS, DO 11/28/24 [id number]  "

## 2025-01-30 ENCOUNTER — Encounter: Admitting: Internal Medicine

## 2025-05-17 ENCOUNTER — Ambulatory Visit: Admitting: Neurology
